# Patient Record
Sex: Female | Born: 1945 | Race: Black or African American | Hispanic: No | Smoking: Current every day smoker
Health system: Southern US, Community
[De-identification: ages and names within clinical notes are randomized; demographics above are authoritative.]

## PROBLEM LIST (undated history)

## (undated) DIAGNOSIS — S82143A Displaced bicondylar fracture of unspecified tibia, initial encounter for closed fracture: Secondary | ICD-10-CM

## (undated) DIAGNOSIS — C801 Malignant (primary) neoplasm, unspecified: Secondary | ICD-10-CM

## (undated) DIAGNOSIS — R202 Paresthesia of skin: Secondary | ICD-10-CM

## (undated) DIAGNOSIS — F32A Depression, unspecified: Secondary | ICD-10-CM

## (undated) DIAGNOSIS — R079 Chest pain, unspecified: Secondary | ICD-10-CM

## (undated) DIAGNOSIS — R011 Cardiac murmur, unspecified: Secondary | ICD-10-CM

## (undated) DIAGNOSIS — R0902 Hypoxemia: Secondary | ICD-10-CM

## (undated) DIAGNOSIS — I10 Essential (primary) hypertension: Secondary | ICD-10-CM

## (undated) DIAGNOSIS — R200 Anesthesia of skin: Secondary | ICD-10-CM

## (undated) DIAGNOSIS — D649 Anemia, unspecified: Secondary | ICD-10-CM

## (undated) DIAGNOSIS — K219 Gastro-esophageal reflux disease without esophagitis: Secondary | ICD-10-CM

## (undated) DIAGNOSIS — I499 Cardiac arrhythmia, unspecified: Secondary | ICD-10-CM

## (undated) DIAGNOSIS — F329 Major depressive disorder, single episode, unspecified: Secondary | ICD-10-CM

## (undated) DIAGNOSIS — H269 Unspecified cataract: Secondary | ICD-10-CM

## (undated) DIAGNOSIS — G4730 Sleep apnea, unspecified: Secondary | ICD-10-CM

## (undated) DIAGNOSIS — M1990 Unspecified osteoarthritis, unspecified site: Secondary | ICD-10-CM

## (undated) DIAGNOSIS — F419 Anxiety disorder, unspecified: Secondary | ICD-10-CM

## (undated) DIAGNOSIS — J449 Chronic obstructive pulmonary disease, unspecified: Secondary | ICD-10-CM

## (undated) DIAGNOSIS — R0602 Shortness of breath: Secondary | ICD-10-CM

## (undated) HISTORY — DX: Anesthesia of skin: R20.0

## (undated) HISTORY — DX: Cardiac arrhythmia, unspecified: I49.9

## (undated) HISTORY — DX: Anesthesia of skin: R20.2

## (undated) HISTORY — PX: COLONOSCOPY: SHX174

## (undated) HISTORY — DX: Chest pain, unspecified: R07.9

## (undated) HISTORY — DX: Chronic obstructive pulmonary disease, unspecified: J44.9

## (undated) HISTORY — PX: TUBAL LIGATION: SHX77

---

## 2010-07-23 ENCOUNTER — Inpatient Hospital Stay (HOSPITAL_COMMUNITY)
Admission: AD | Admit: 2010-07-23 | Discharge: 2010-07-27 | Payer: Self-pay | Source: Home / Self Care | Attending: Psychiatry | Admitting: Psychiatry

## 2010-10-31 LAB — DIFFERENTIAL
Basophils Absolute: 0 10*3/uL (ref 0.0–0.1)
Basophils Relative: 1 % (ref 0–1)
Eosinophils Absolute: 0.1 10*3/uL (ref 0.0–0.7)
Eosinophils Relative: 1 % (ref 0–5)
Lymphocytes Relative: 25 % (ref 12–46)
Lymphs Abs: 1.7 10*3/uL (ref 0.7–4.0)
Monocytes Absolute: 0.3 10*3/uL (ref 0.1–1.0)
Monocytes Relative: 5 % (ref 3–12)
Neutro Abs: 4.6 10*3/uL (ref 1.7–7.7)
Neutrophils Relative %: 69 % (ref 43–77)

## 2010-10-31 LAB — BASIC METABOLIC PANEL
BUN: 10 mg/dL (ref 6–23)
CO2: 27 mEq/L (ref 19–32)
Calcium: 9.4 mg/dL (ref 8.4–10.5)
Chloride: 103 mEq/L (ref 96–112)
Creatinine, Ser: 0.79 mg/dL (ref 0.4–1.2)
GFR calc Af Amer: >60 mL/min (ref >60)
GFR calc non Af Amer: >60 mL/min (ref >60)
Glucose, Bld: 99 mg/dL (ref 70–99)
Potassium: 4.1 mEq/L (ref 3.5–5.1)
Sodium: 139 mEq/L (ref 135–145)

## 2010-10-31 LAB — CBC
HCT: 36.9 % (ref 36.0–46.0)
Hemoglobin: 12.6 g/dL (ref 12.0–15.0)
MCH: 32.7 pg (ref 26.0–34.0)
MCHC: 34.1 g/dL (ref 30.0–36.0)
MCV: 95.8 fL (ref 78.0–100.0)
Platelets: 194 10*3/uL (ref 150–400)
RBC: 3.85 MIL/uL — ABNORMAL LOW (ref 3.87–5.11)
RDW: 13.3 % (ref 11.5–15.5)
WBC: 6.7 10*3/uL (ref 4.0–10.5)

## 2010-10-31 LAB — TSH: TSH: 1.047 u[IU]/mL (ref 0.350–4.500)

## 2010-10-31 LAB — VITAMIN B12: Vitamin B-12: 347 pg/mL (ref 211–911)

## 2010-10-31 LAB — RPR: RPR Ser Ql: NONREACTIVE

## 2012-12-07 HISTORY — PX: INCISION AND DRAINAGE OF WOUND: SHX1803

## 2012-12-16 ENCOUNTER — Ambulatory Visit
Admission: RE | Admit: 2012-12-16 | Discharge: 2012-12-16 | Disposition: A | Discharge status: Home / Self Care | Payer: Medicare Other | Source: Ambulatory Visit | Attending: Orthopedic Surgery | Admitting: Orthopedic Surgery

## 2012-12-16 ENCOUNTER — Other Ambulatory Visit: Payer: Self-pay | Admitting: Orthopedic Surgery

## 2012-12-16 DIAGNOSIS — R52 Pain, unspecified: Secondary | ICD-10-CM

## 2012-12-22 ENCOUNTER — Encounter (HOSPITAL_COMMUNITY): Payer: Self-pay | Admitting: Pharmacy Technician

## 2012-12-22 ENCOUNTER — Encounter (HOSPITAL_COMMUNITY): Payer: Self-pay | Admitting: Orthopedic Surgery

## 2012-12-23 ENCOUNTER — Encounter (HOSPITAL_COMMUNITY): Payer: Self-pay | Admitting: *Deleted

## 2012-12-23 ENCOUNTER — Encounter (HOSPITAL_COMMUNITY): Payer: Self-pay | Admitting: Anesthesiology

## 2012-12-23 ENCOUNTER — Encounter (HOSPITAL_COMMUNITY)
Admission: RE | Disposition: A | Discharge status: Skilled Nursing Facility | Payer: Self-pay | Source: Ambulatory Visit | Attending: Orthopedic Surgery

## 2012-12-23 ENCOUNTER — Inpatient Hospital Stay (HOSPITAL_COMMUNITY): Payer: Medicare Other

## 2012-12-23 ENCOUNTER — Inpatient Hospital Stay (HOSPITAL_COMMUNITY): Payer: Medicare Other | Admitting: Anesthesiology

## 2012-12-23 ENCOUNTER — Inpatient Hospital Stay (HOSPITAL_COMMUNITY)
Admission: RE | Admit: 2012-12-23 | Discharge: 2012-12-26 | DRG: 494 | Disposition: A | Discharge status: Skilled Nursing Facility | Payer: Medicare Other | Source: Ambulatory Visit | Attending: Orthopedic Surgery | Admitting: Orthopedic Surgery

## 2012-12-23 DIAGNOSIS — S82109A Unspecified fracture of upper end of unspecified tibia, initial encounter for closed fracture: Principal | ICD-10-CM | POA: Diagnosis present

## 2012-12-23 DIAGNOSIS — F3289 Other specified depressive episodes: Secondary | ICD-10-CM | POA: Diagnosis present

## 2012-12-23 DIAGNOSIS — S82142A Displaced bicondylar fracture of left tibia, initial encounter for closed fracture: Secondary | ICD-10-CM

## 2012-12-23 DIAGNOSIS — Z79899 Other long term (current) drug therapy: Secondary | ICD-10-CM

## 2012-12-23 DIAGNOSIS — K219 Gastro-esophageal reflux disease without esophagitis: Secondary | ICD-10-CM | POA: Diagnosis present

## 2012-12-23 DIAGNOSIS — X58XXXA Exposure to other specified factors, initial encounter: Secondary | ICD-10-CM | POA: Diagnosis present

## 2012-12-23 DIAGNOSIS — Z7982 Long term (current) use of aspirin: Secondary | ICD-10-CM

## 2012-12-23 DIAGNOSIS — F17200 Nicotine dependence, unspecified, uncomplicated: Secondary | ICD-10-CM | POA: Diagnosis present

## 2012-12-23 DIAGNOSIS — F411 Generalized anxiety disorder: Secondary | ICD-10-CM | POA: Diagnosis present

## 2012-12-23 DIAGNOSIS — G4730 Sleep apnea, unspecified: Secondary | ICD-10-CM | POA: Diagnosis present

## 2012-12-23 DIAGNOSIS — S82143A Displaced bicondylar fracture of unspecified tibia, initial encounter for closed fracture: Secondary | ICD-10-CM

## 2012-12-23 DIAGNOSIS — F329 Major depressive disorder, single episode, unspecified: Secondary | ICD-10-CM | POA: Diagnosis present

## 2012-12-23 DIAGNOSIS — I10 Essential (primary) hypertension: Secondary | ICD-10-CM | POA: Diagnosis present

## 2012-12-23 HISTORY — DX: Anemia, unspecified: D64.9

## 2012-12-23 HISTORY — DX: Cardiac murmur, unspecified: R01.1

## 2012-12-23 HISTORY — DX: Essential (primary) hypertension: I10

## 2012-12-23 HISTORY — DX: Shortness of breath: R06.02

## 2012-12-23 HISTORY — DX: Displaced bicondylar fracture of unspecified tibia, initial encounter for closed fracture: S82.143A

## 2012-12-23 HISTORY — DX: Major depressive disorder, single episode, unspecified: F32.9

## 2012-12-23 HISTORY — PX: ORIF TIBIA PLATEAU: SHX2132

## 2012-12-23 HISTORY — DX: Sleep apnea, unspecified: G47.30

## 2012-12-23 HISTORY — DX: Anxiety disorder, unspecified: F41.9

## 2012-12-23 HISTORY — DX: Depression, unspecified: F32.A

## 2012-12-23 HISTORY — DX: Gastro-esophageal reflux disease without esophagitis: K21.9

## 2012-12-23 HISTORY — DX: Unspecified osteoarthritis, unspecified site: M19.90

## 2012-12-23 HISTORY — PX: ORIF PROXIMAL TIBIAL PLATEAU FRACTURE: SUR953

## 2012-12-23 LAB — BASIC METABOLIC PANEL
BUN: 17 mg/dL (ref 6–23)
CO2: 31 mEq/L (ref 19–32)
Calcium: 9.9 mg/dL (ref 8.4–10.5)
Chloride: 96 mEq/L (ref 96–112)
Creatinine, Ser: 1 mg/dL (ref 0.50–1.10)
GFR calc Af Amer: 67 mL/min — ABNORMAL LOW (ref >90)
GFR calc non Af Amer: 58 mL/min — ABNORMAL LOW (ref >90)
Glucose, Bld: 104 mg/dL — ABNORMAL HIGH (ref 70–99)
Potassium: 3.9 mEq/L (ref 3.5–5.1)
Sodium: 136 mEq/L (ref 135–145)

## 2012-12-23 LAB — CBC
HCT: 32.9 % — ABNORMAL LOW (ref 36.0–46.0)
Hemoglobin: 11.3 g/dL — ABNORMAL LOW (ref 12.0–15.0)
MCH: 29.8 pg (ref 26.0–34.0)
MCHC: 34.3 g/dL (ref 30.0–36.0)
MCV: 86.8 fL (ref 78.0–100.0)
Platelets: 456 10*3/uL — ABNORMAL HIGH (ref 150–400)
RBC: 3.79 MIL/uL — ABNORMAL LOW (ref 3.87–5.11)
RDW: 13.7 % (ref 11.5–15.5)
WBC: 8.4 10*3/uL (ref 4.0–10.5)

## 2012-12-23 LAB — SURGICAL PCR SCREEN
MRSA, PCR: NEGATIVE
Staphylococcus aureus: NEGATIVE

## 2012-12-23 SURGERY — OPEN REDUCTION INTERNAL FIXATION (ORIF) TIBIAL PLATEAU
Anesthesia: General | Site: Knee | Laterality: Left | Wound class: Clean

## 2012-12-23 MED ORDER — DOCUSATE SODIUM 100 MG PO CAPS
100.0000 mg | ORAL_CAPSULE | Freq: Two times a day (BID) | ORAL | Status: DC
  Administered 2012-12-23 – 2012-12-26 (×6): 100 mg via ORAL
  Filled 2012-12-23 (×8): qty 1

## 2012-12-23 MED ORDER — LORAZEPAM 0.5 MG PO TABS: 0.5000 mg | ORAL_TABLET | Freq: Every evening | ORAL | Status: DC | PRN

## 2012-12-23 MED ORDER — BUPIVACAINE HCL (PF) 0.25 % IJ SOLN
INTRAMUSCULAR | Status: DC | PRN
  Administered 2012-12-23: 30 mL

## 2012-12-23 MED ORDER — EPHEDRINE SULFATE 50 MG/ML IJ SOLN
INTRAMUSCULAR | Status: DC | PRN
  Administered 2012-12-23: 5 mg via INTRAVENOUS
  Administered 2012-12-23: 10 mg via INTRAVENOUS

## 2012-12-23 MED ORDER — WARFARIN SODIUM 7.5 MG PO TABS
7.5000 mg | ORAL_TABLET | Freq: Once | ORAL | Status: AC
  Administered 2012-12-23: 7.5 mg via ORAL
  Filled 2012-12-23: qty 1

## 2012-12-23 MED ORDER — PROPOFOL 10 MG/ML IV BOLUS
INTRAVENOUS | Status: DC | PRN
  Administered 2012-12-23: 160 mg via INTRAVENOUS

## 2012-12-23 MED ORDER — PHENOL 1.4 % MT LIQD
1.0000 | OROMUCOSAL | Status: DC | PRN
  Filled 2012-12-23: qty 177

## 2012-12-23 MED ORDER — MENTHOL 3 MG MT LOZG
1.0000 | LOZENGE | OROMUCOSAL | Status: DC | PRN
  Filled 2012-12-23 (×2): qty 9

## 2012-12-23 MED ORDER — ENOXAPARIN SODIUM 40 MG/0.4ML ~~LOC~~ SOLN
40.0000 mg | SUBCUTANEOUS | Status: DC
  Administered 2012-12-24 – 2012-12-26 (×3): 40 mg via SUBCUTANEOUS
  Filled 2012-12-23 (×4): qty 0.4

## 2012-12-23 MED ORDER — ROCURONIUM BROMIDE 100 MG/10ML IV SOLN
INTRAVENOUS | Status: DC | PRN
  Administered 2012-12-23: 30 mg via INTRAVENOUS

## 2012-12-23 MED ORDER — CEFAZOLIN SODIUM-DEXTROSE 2-3 GM-% IV SOLR
2.0000 g | INTRAVENOUS | Status: AC
  Administered 2012-12-23: 2 g via INTRAVENOUS
  Filled 2012-12-23: qty 50

## 2012-12-23 MED ORDER — CEFAZOLIN SODIUM-DEXTROSE 2-3 GM-% IV SOLR
2.0000 g | Freq: Once | INTRAVENOUS | Status: AC
  Administered 2012-12-24: 2 g via INTRAVENOUS
  Filled 2012-12-23: qty 50

## 2012-12-23 MED ORDER — BISACODYL 10 MG RE SUPP: 10.0000 mg | Freq: Every day | RECTAL | Status: DC | PRN

## 2012-12-23 MED ORDER — ONDANSETRON HCL 4 MG/2ML IJ SOLN: 4.0000 mg | Freq: Once | INTRAMUSCULAR | Status: DC | PRN

## 2012-12-23 MED ORDER — MIDAZOLAM HCL 5 MG/5ML IJ SOLN
INTRAMUSCULAR | Status: DC | PRN
  Administered 2012-12-23: 2 mg via INTRAVENOUS

## 2012-12-23 MED ORDER — PHENYLEPHRINE HCL 10 MG/ML IJ SOLN
INTRAMUSCULAR | Status: DC | PRN
  Administered 2012-12-23 (×2): 40 ug via INTRAVENOUS

## 2012-12-23 MED ORDER — ARTIFICIAL TEARS OP OINT
TOPICAL_OINTMENT | OPHTHALMIC | Status: DC | PRN
  Administered 2012-12-23: 1 via OPHTHALMIC

## 2012-12-23 MED ORDER — FENTANYL CITRATE 0.05 MG/ML IJ SOLN
INTRAMUSCULAR | Status: DC | PRN
  Administered 2012-12-23: 50 ug via INTRAVENOUS
  Administered 2012-12-23 (×2): 100 ug via INTRAVENOUS

## 2012-12-23 MED ORDER — ACETAMINOPHEN 325 MG PO TABS: 650.0000 mg | ORAL_TABLET | Freq: Four times a day (QID) | ORAL | Status: DC | PRN

## 2012-12-23 MED ORDER — NEOSTIGMINE METHYLSULFATE 1 MG/ML IJ SOLN
INTRAMUSCULAR | Status: DC | PRN
  Administered 2012-12-23: 3 mg via INTRAVENOUS

## 2012-12-23 MED ORDER — ONDANSETRON HCL 4 MG/2ML IJ SOLN
INTRAMUSCULAR | Status: DC | PRN
  Administered 2012-12-23: 4 mg via INTRAVENOUS

## 2012-12-23 MED ORDER — COUMADIN BOOK
Freq: Once | Status: AC
  Administered 2012-12-23: 16:00:00
  Filled 2012-12-23: qty 1

## 2012-12-23 MED ORDER — LOSARTAN POTASSIUM 50 MG PO TABS
50.0000 mg | ORAL_TABLET | Freq: Every day | ORAL | Status: DC
  Administered 2012-12-23 – 2012-12-26 (×4): 50 mg via ORAL
  Filled 2012-12-23 (×4): qty 1

## 2012-12-23 MED ORDER — ONDANSETRON HCL 4 MG/2ML IJ SOLN
4.0000 mg | Freq: Four times a day (QID) | INTRAMUSCULAR | Status: DC | PRN
  Administered 2012-12-23: 4 mg via INTRAVENOUS
  Filled 2012-12-23: qty 2

## 2012-12-23 MED ORDER — LACTATED RINGERS IV SOLN
INTRAVENOUS | Status: DC | PRN
  Administered 2012-12-23 (×2): via INTRAVENOUS

## 2012-12-23 MED ORDER — MUPIROCIN 2 % EX OINT
TOPICAL_OINTMENT | Freq: Two times a day (BID) | CUTANEOUS | Status: DC
  Administered 2012-12-23: 23:00:00 via NASAL
  Administered 2012-12-23: 1 via NASAL
  Administered 2012-12-24: 22:00:00 via NASAL
  Administered 2012-12-25: 1 via NASAL
  Administered 2012-12-25: 21:00:00 via NASAL

## 2012-12-23 MED ORDER — ASPIRIN EC 81 MG PO TBEC
81.0000 mg | DELAYED_RELEASE_TABLET | Freq: Every day | ORAL | Status: DC
  Administered 2012-12-23 – 2012-12-26 (×3): 81 mg via ORAL
  Filled 2012-12-23 (×4): qty 1

## 2012-12-23 MED ORDER — LACTATED RINGERS IV SOLN
INTRAVENOUS | Status: DC
  Administered 2012-12-23: 09:00:00 via INTRAVENOUS

## 2012-12-23 MED ORDER — POLYETHYLENE GLYCOL 3350 17 G PO PACK
17.0000 g | PACK | Freq: Every day | ORAL | Status: DC | PRN
  Filled 2012-12-23: qty 1

## 2012-12-23 MED ORDER — MAGNESIUM CITRATE PO SOLN
1.0000 | Freq: Once | ORAL | Status: AC | PRN
  Filled 2012-12-23: qty 296

## 2012-12-23 MED ORDER — SENNA-DOCUSATE SODIUM 8.6-50 MG PO TABS: 1.0000 | ORAL_TABLET | Freq: Every day | ORAL | Status: DC

## 2012-12-23 MED ORDER — ALUM & MAG HYDROXIDE-SIMETH 200-200-20 MG/5ML PO SUSP: 30.0000 mL | ORAL | Status: DC | PRN

## 2012-12-23 MED ORDER — METOCLOPRAMIDE HCL 5 MG/ML IJ SOLN
5.0000 mg | Freq: Three times a day (TID) | INTRAMUSCULAR | Status: DC | PRN
  Filled 2012-12-23: qty 2

## 2012-12-23 MED ORDER — LORATADINE 10 MG PO TABS
10.0000 mg | ORAL_TABLET | Freq: Every day | ORAL | Status: DC
  Administered 2012-12-23 – 2012-12-26 (×4): 10 mg via ORAL
  Filled 2012-12-23 (×4): qty 1

## 2012-12-23 MED ORDER — OXYCODONE HCL 5 MG PO TABS
5.0000 mg | ORAL_TABLET | ORAL | Status: DC | PRN
  Administered 2012-12-23 – 2012-12-26 (×12): 10 mg via ORAL
  Filled 2012-12-23 (×12): qty 2

## 2012-12-23 MED ORDER — SENNA 8.6 MG PO TABS
1.0000 | ORAL_TABLET | Freq: Two times a day (BID) | ORAL | Status: DC
  Administered 2012-12-23 – 2012-12-26 (×6): 8.6 mg via ORAL
  Filled 2012-12-23 (×7): qty 1

## 2012-12-23 MED ORDER — WARFARIN - PHARMACIST DOSING INPATIENT: Freq: Every day | Status: DC

## 2012-12-23 MED ORDER — OXYCODONE-ACETAMINOPHEN 10-325 MG PO TABS: 1.0000 | ORAL_TABLET | Freq: Four times a day (QID) | ORAL | Status: DC | PRN

## 2012-12-23 MED ORDER — WARFARIN SODIUM 5 MG PO TABS: 5.0000 mg | ORAL_TABLET | Freq: Every day | ORAL | Status: DC

## 2012-12-23 MED ORDER — FLUOXETINE HCL 20 MG PO CAPS
60.0000 mg | ORAL_CAPSULE | Freq: Every day | ORAL | Status: DC
  Administered 2012-12-23 – 2012-12-26 (×4): 60 mg via ORAL
  Filled 2012-12-23 (×4): qty 3

## 2012-12-23 MED ORDER — ONDANSETRON HCL 4 MG PO TABS: 4.0000 mg | ORAL_TABLET | Freq: Four times a day (QID) | ORAL | Status: DC | PRN

## 2012-12-23 MED ORDER — MUPIROCIN 2 % EX OINT
TOPICAL_OINTMENT | CUTANEOUS | Status: AC
  Filled 2012-12-23: qty 22

## 2012-12-23 MED ORDER — HYDROCHLOROTHIAZIDE 12.5 MG PO CAPS
12.5000 mg | ORAL_CAPSULE | Freq: Every day | ORAL | Status: DC
  Administered 2012-12-23 – 2012-12-26 (×4): 12.5 mg via ORAL
  Filled 2012-12-23 (×4): qty 1

## 2012-12-23 MED ORDER — HYDROMORPHONE HCL PF 1 MG/ML IJ SOLN
INTRAMUSCULAR | Status: AC
  Filled 2012-12-23: qty 1

## 2012-12-23 MED ORDER — POTASSIUM CHLORIDE IN NACL 20-0.45 MEQ/L-% IV SOLN
INTRAVENOUS | Status: DC
  Administered 2012-12-23: 20 mL/h via INTRAVENOUS
  Filled 2012-12-23 (×4): qty 1000

## 2012-12-23 MED ORDER — CEFAZOLIN SODIUM-DEXTROSE 2-3 GM-% IV SOLR
INTRAVENOUS | Status: AC
  Filled 2012-12-23: qty 50

## 2012-12-23 MED ORDER — HYDROMORPHONE HCL PF 1 MG/ML IJ SOLN
0.5000 mg | INTRAMUSCULAR | Status: DC | PRN
  Administered 2012-12-23 – 2012-12-24 (×8): 0.5 mg via INTRAVENOUS
  Filled 2012-12-23 (×9): qty 1

## 2012-12-23 MED ORDER — LIDOCAINE HCL (CARDIAC) 20 MG/ML IV SOLN
INTRAVENOUS | Status: DC | PRN
  Administered 2012-12-23: 60 mg via INTRAVENOUS

## 2012-12-23 MED ORDER — 0.9 % SODIUM CHLORIDE (POUR BTL) OPTIME
TOPICAL | Status: DC | PRN
  Administered 2012-12-23: 1000 mL

## 2012-12-23 MED ORDER — ACETAMINOPHEN 650 MG RE SUPP: 650.0000 mg | Freq: Four times a day (QID) | RECTAL | Status: DC | PRN

## 2012-12-23 MED ORDER — METOCLOPRAMIDE HCL 10 MG PO TABS: 5.0000 mg | ORAL_TABLET | Freq: Three times a day (TID) | ORAL | Status: DC | PRN

## 2012-12-23 MED ORDER — CEFAZOLIN SODIUM-DEXTROSE 2-3 GM-% IV SOLR
2.0000 g | Freq: Four times a day (QID) | INTRAVENOUS | Status: DC
  Administered 2012-12-23: 2 g via INTRAVENOUS
  Filled 2012-12-23 (×2): qty 50

## 2012-12-23 MED ORDER — HYDROMORPHONE HCL PF 1 MG/ML IJ SOLN
0.2500 mg | INTRAMUSCULAR | Status: DC | PRN
  Administered 2012-12-23 (×3): 0.5 mg via INTRAVENOUS

## 2012-12-23 MED ORDER — GLYCOPYRROLATE 0.2 MG/ML IJ SOLN
INTRAMUSCULAR | Status: DC | PRN
  Administered 2012-12-23: 0.4 mg via INTRAVENOUS

## 2012-12-23 MED ORDER — LOSARTAN POTASSIUM-HCTZ 50-12.5 MG PO TABS: 1.0000 | ORAL_TABLET | Freq: Every day | ORAL | Status: DC

## 2012-12-23 MED ORDER — BUPIVACAINE HCL (PF) 0.25 % IJ SOLN
INTRAMUSCULAR | Status: AC
  Filled 2012-12-23: qty 30

## 2012-12-23 MED ORDER — WARFARIN VIDEO
Freq: Once | Status: AC
  Administered 2012-12-23: 16:00:00

## 2012-12-23 SURGICAL SUPPLY — 67 items
BANDAGE ELASTIC 6 VELCRO ST LF (GAUZE/BANDAGES/DRESSINGS) IMPLANT
BANDAGE ESMARK 6X9 LF (GAUZE/BANDAGES/DRESSINGS) ×1 IMPLANT
BENZOIN TINCTURE PRP APPL 2/3 (GAUZE/BANDAGES/DRESSINGS) ×2 IMPLANT
BNDG ELASTIC 6X15 VLCR STRL LF (GAUZE/BANDAGES/DRESSINGS) ×2 IMPLANT
BNDG ESMARK 6X9 LF (GAUZE/BANDAGES/DRESSINGS) ×2
BOOTCOVER CLEANROOM LRG (PROTECTIVE WEAR) IMPLANT
CLOTH BEACON ORANGE TIMEOUT ST (SAFETY) ×2 IMPLANT
COVER SURGICAL LIGHT HANDLE (MISCELLANEOUS) ×2 IMPLANT
COVER TABLE BACK 60X90 (DRAPES) ×2 IMPLANT
CUFF TOURNIQUET SINGLE 34IN LL (TOURNIQUET CUFF) ×2 IMPLANT
CUFF TOURNIQUET SINGLE 44IN (TOURNIQUET CUFF) IMPLANT
DECANTER SPIKE VIAL GLASS SM (MISCELLANEOUS) IMPLANT
DRAPE C-ARM 42X72 X-RAY (DRAPES) ×2 IMPLANT
DRAPE C-ARMOR (DRAPES) ×2 IMPLANT
DRAPE OEC MINIVIEW 54X84 (DRAPES) IMPLANT
DRAPE ORTHO SPLIT 77X108 STRL (DRAPES) ×1
DRAPE SURG ORHT 6 SPLT 77X108 (DRAPES) ×1 IMPLANT
DRAPE U-SHAPE 47X51 STRL (DRAPES) ×2 IMPLANT
DRSG PAD ABDOMINAL 8X10 ST (GAUZE/BANDAGES/DRESSINGS) ×2 IMPLANT
DURAPREP 26ML APPLICATOR (WOUND CARE) ×2 IMPLANT
ELECT REM PT RETURN 9FT ADLT (ELECTROSURGICAL) ×2
ELECTRODE REM PT RTRN 9FT ADLT (ELECTROSURGICAL) ×1 IMPLANT
GAUZE XEROFORM 1X8 LF (GAUZE/BANDAGES/DRESSINGS) IMPLANT
GLOVE BIOGEL PI IND STRL 7.5 (GLOVE) ×1 IMPLANT
GLOVE BIOGEL PI INDICATOR 7.5 (GLOVE) ×1
GLOVE BIOGEL PI ORTHO PRO SZ8 (GLOVE) ×1
GLOVE ORTHO TXT STRL SZ7.5 (GLOVE) ×2 IMPLANT
GLOVE PI ORTHO PRO STRL SZ8 (GLOVE) ×1 IMPLANT
GLOVE SURG ORTHO 8.0 STRL STRW (GLOVE) ×4 IMPLANT
GOWN STRL NON-REIN LRG LVL3 (GOWN DISPOSABLE) IMPLANT
K-WIRE 5 THRD TROCAR 1.6X150 (WIRE) ×4
KIT BASIN OR (CUSTOM PROCEDURE TRAY) ×2 IMPLANT
KIT ROOM TURNOVER OR (KITS) ×2 IMPLANT
KWIRE 5 THRD TROCAR 1.6X150 (WIRE) ×2 IMPLANT
MANIFOLD NEPTUNE II (INSTRUMENTS) IMPLANT
NEEDLE 22X1 1/2 (OR ONLY) (NEEDLE) ×2 IMPLANT
NS IRRIG 1000ML POUR BTL (IV SOLUTION) ×2 IMPLANT
PACK ORTHO EXTREMITY (CUSTOM PROCEDURE TRAY) ×2 IMPLANT
PAD ARMBOARD 7.5X6 YLW CONV (MISCELLANEOUS) ×4 IMPLANT
PAD CAST 4YDX4 CTTN HI CHSV (CAST SUPPLIES) IMPLANT
PADDING CAST COTTON 4X4 STRL (CAST SUPPLIES)
PADDING CAST COTTON 6X4 STRL (CAST SUPPLIES) ×2 IMPLANT
PLATE TIBIA LCP 6H LEFT 107MM (Plate) ×2 IMPLANT
SCREW CORTEX 3.5 28MM (Screw) ×1 IMPLANT
SCREW CORTEX 3.5 30MM (Screw) ×2 IMPLANT
SCREW CORTEX 3.5X75MM (Screw) ×2 IMPLANT
SCREW LOCK CORT ST 3.5X28 (Screw) ×1 IMPLANT
SCREW LOCK CORT ST 3.5X30 (Screw) ×2 IMPLANT
SCREW LOCKING SLF TAP 3.5X70MM (Screw) ×4 IMPLANT
SCREW LOCKING SLF TAP 3.5X75MM (Screw) ×4 IMPLANT
SCREW SELF TAP 65MM (Screw) ×4 IMPLANT
SPONGE GAUZE 4X4 12PLY (GAUZE/BANDAGES/DRESSINGS) ×2 IMPLANT
SPONGE LAP 4X18 X RAY DECT (DISPOSABLE) ×4 IMPLANT
STAPLER VISISTAT 35W (STAPLE) ×2 IMPLANT
STRIP CLOSURE SKIN 1/2X4 (GAUZE/BANDAGES/DRESSINGS) ×4 IMPLANT
SUCTION FRAZIER TIP 10 FR DISP (SUCTIONS) ×2 IMPLANT
SUT MNCRL AB 4-0 PS2 18 (SUTURE) ×2 IMPLANT
SUT VIC AB 0 CT1 27 (SUTURE) ×1
SUT VIC AB 0 CT1 27XBRD ANBCTR (SUTURE) ×1 IMPLANT
SUT VIC AB 3-0 SH 8-18 (SUTURE) ×2 IMPLANT
SYR CONTROL 10ML LL (SYRINGE) ×2 IMPLANT
TOWEL OR 17X24 6PK STRL BLUE (TOWEL DISPOSABLE) ×2 IMPLANT
TOWEL OR 17X26 10 PK STRL BLUE (TOWEL DISPOSABLE) ×2 IMPLANT
TUBE CONNECTING 12X1/4 (SUCTIONS) ×2 IMPLANT
UNDERPAD 30X30 INCONTINENT (UNDERPADS AND DIAPERS) IMPLANT
WATER STERILE IRR 1000ML POUR (IV SOLUTION) IMPLANT
YANKAUER SUCT BULB TIP NO VENT (SUCTIONS) ×2 IMPLANT

## 2012-12-23 NOTE — Preoperative (Signed)
Beta Blockers   Reason not to administer Beta Blockers:Not Applicable 

## 2012-12-23 NOTE — Progress Notes (Signed)
UR COMPLETED  

## 2012-12-23 NOTE — Addendum Note (Signed)
Addendum created 12/23/12 1350 by Fransisca Kaufmann, CRNA   Modules edited: Anesthesia Medication Administration

## 2012-12-23 NOTE — Transfer of Care (Signed)
Immediate Anesthesia Transfer of Care Note  Patient: Katrina Ball  Procedure(s) Performed: Procedure(s): OPEN REDUCTION INTERNAL FIXATION (ORIF) LEFT TIBIAL PLATEAU (Left)  Patient Location: PACU   Anesthesia Type:General  Level of Consciousness: awake, alert , oriented and sedated  Airway & Oxygen Therapy: Patient Spontanous Breathing and Patient connected to nasal cannula oxygen  Post-op Assessment: Report given to PACU RN, Post -op Vital signs reviewed and stable and Patient moving all extremities  Post vital signs: Reviewed and stable  Complications: No apparent anesthesia complications

## 2012-12-23 NOTE — Anesthesia Preprocedure Evaluation (Signed)
Anesthesia Evaluation  Patient identified by MRN, date of birth, ID band Patient awake    Reviewed: Allergy & Precautions, H&P , NPO status , Patient's Chart, lab work & pertinent test results  Airway Mallampati: II TM Distance: >3 FB Neck ROM: full    Dental   Pulmonary COPDCurrent Smoker,          Cardiovascular hypertension, Pt. on medications Rhythm:regular Rate:Normal     Neuro/Psych    GI/Hepatic   Endo/Other    Renal/GU      Musculoskeletal   Abdominal   Peds  Hematology   Anesthesia Other Findings   Reproductive/Obstetrics                           Anesthesia Physical Anesthesia Plan  ASA: II  Anesthesia Plan: General   Post-op Pain Management:    Induction: Intravenous  Airway Management Planned: Oral ETT  Additional Equipment:   Intra-op Plan:   Post-operative Plan: Extubation in OR  Informed Consent: I have reviewed the patients History and Physical, chart, labs and discussed the procedure including the risks, benefits and alternatives for the proposed anesthesia with the patient or authorized representative who has indicated his/her understanding and acceptance.     Plan Discussed with: CRNA, Anesthesiologist and Surgeon  Anesthesia Plan Comments:         Anesthesia Quick Evaluation

## 2012-12-23 NOTE — Progress Notes (Signed)
Good cms t/o pacu stay, 2 plus pedal pulse,able to wiggle toes

## 2012-12-23 NOTE — Op Note (Signed)
12/23/2012  11:27 AM  PATIENT:  Katrina Ball    PRE-OPERATIVE DIAGNOSIS:  Left Tibial Plateau Fracture, bicondylar with intracondylar split  POST-OPERATIVE DIAGNOSIS:  Same  PROCEDURE:  OPEN REDUCTION INTERNAL FIXATION (ORIF) LEFT BICONDYLAR TIBIAL PLATEAU fracture  SURGEON:  Eulas Post, MD  PHYSICIAN ASSISTANT: Janace Litten, OPA-C, present and scrubbed throughout the case, critical for completion in a timely fashion, and for retraction, instrumentation, and closure.  ANESTHESIA:   General  PREOPERATIVE INDICATIONS:  Katrina Ball is a  67 y.o. female with a diagnosis of Left Tibial Plateau Fracture who failed conservative measures and elected for surgical management.  She had some varus alignment, with mild depression of the medial condyle. The rest of the fracture was minimally displaced. I recommended surgical intervention to stabilize the fracture, optimize healing, and minimize the risk for posttraumatic varus osteoarthritis.  The risks benefits and alternatives were discussed with the patient preoperatively including but not limited to the risks of infection, bleeding, nerve injury, cardiopulmonary complications, the need for revision surgery, among others, and the patient was willing to proceed.  OPERATIVE IMPLANTS: Synthes proximal tibial locking plate with a total of 4 proximal locking screws, one could and screw, and 3 distal cortical screws.  OPERATIVE FINDINGS: Mild varus alignment it was correctable with the use of a large clamp and reduction maneuvers.   OPERATIVE PROCEDURE:  the patient is brought to the operating room and placed in supine position. General anesthesia was administered. IV antibiotics were given. The left lower extremity was prepped and draped in usual sterile fashion. Time out was performed. The leg was elevated and exsanguinated and the tourniquet was inflated. Total tourniquet time was approximately one hour and 15 minutes at 300 mm mercury. She  had a transverse laceration over her proximal tibia, from the previous trauma, which had mostly healed. I removed the staples. A longitudinal incision was made lateral to the crest of the tibia up to the level of the joint line. Dissection was carried down and the periosteum and fascia of the anterior compartment was elevated. I did not open the joint. I exposed the proximal tibia, and applied the plate into the appropriate location. I confirmed its position on AP and lateral views. I used K wires initially, and slightly distally, in order to allow for ultimate correction of the varus alignment. I also used a cane, and clamp percutaneously over the skin medially, and on the posterior hole of the plate laterally. I placed a bicortical proximal nonlocking screw first, to compress the joint line, reduced the fracture, and bring the plate to the proximal tibia. I then secured the plate with cortical locking screws proximally, and converted the nonlocking screw to a cortical screw.  I confirmed anatomic reduction of the joint line on AP, lateral, and oblique views. I then secured the plate distally using cortical screws. Excellent fixation was achieved.  I placed one more proximal screw through the kick stand in order to buttress the medial side.  Final C-arm pictures were taken, and I irrigated the wounds copiously, and repaired the fascia with Vicryl followed by Vicryl and Monocryl with Steri-Strips and sterile gauze across the previous laceration as well as the surgical incision. She was placed back into her knee immobilizer. She was awakened and returned to the PACU in stable and satisfactory condition. There were no complications and she tolerated the procedure well. She will be nonweightbearing for a period of 8 weeks, and then 50% weightbearing for another 4 weeks after  that before going to full weightbearing. We will also plan to do DVT prophylaxis with Lovenox bridging to Coumadin postoperatively. This  will be for duration of one month.

## 2012-12-23 NOTE — Anesthesia Procedure Notes (Signed)
Procedure Name: Intubation Date/Time: 12/23/2012 10:07 AM Performed by: Brien Mates DOBSON Pre-anesthesia Checklist: Patient identified, Emergency Drugs available, Suction available, Patient being monitored and Timeout performed Patient Re-evaluated:Patient Re-evaluated prior to inductionOxygen Delivery Method: Circle system utilized Preoxygenation: Pre-oxygenation with 100% oxygen Intubation Type: IV induction Ventilation: Mask ventilation without difficulty Laryngoscope Size: Miller and 2 Grade View: Grade I Tube type: Oral Number of attempts: 1 Airway Equipment and Method: Stylet Placement Confirmation: ETT inserted through vocal cords under direct vision Secured at: 21 cm Tube secured with: Tape Dental Injury: Teeth and Oropharynx as per pre-operative assessment

## 2012-12-23 NOTE — Consult Note (Signed)
ANTICOAGULATION CONSULT NOTE - Initial Consult  Pharmacy Consult for Coumadin Indication: VTE prophylaxis  No Known Allergies  Vital Signs: Temp: 97 F (36.1 C) (05/06 1445) Temp src: Oral (05/06 0734) BP: 144/62 mmHg (05/06 1440) Pulse Rate: 87 (05/06 1445)  Labs:  Recent Labs  12/23/12 0723  HGB 11.3*  HCT 32.9*  PLT 456*  CREATININE 1.00    CrCl is unknown because there is no height on file for the current visit.   Medical History: Past Medical History  Diagnosis Date  . Hypertension   . Depression   . Heart murmur   . Arthritis   . Fracture of tibial plateau, closed 12/23/2012    Medications:  No anticoagulants pta  Assessment: 67yof s/p ORIF left tibial plateau fracture to begin coumadin for VTE prophylaxis. No baseline INR available. Coumadin score = 4.  Goal of Therapy:  INR 2-3 Monitor platelets by anticoagulation protocol: Yes   Plan:  1) Coumadin 7.5mg  x 1 2) Daily INR 3) Coumadin education - book/video  Fredrik Rigger 12/23/2012,3:07 PM

## 2012-12-23 NOTE — Progress Notes (Signed)
Orthopedic Tech Progress Note Patient Details:  Katrina Ball 1945-09-05 [id number] Applied overhead frame to bed.     Jennye Moccasin 12/23/2012, 6:02 PM

## 2012-12-23 NOTE — Anesthesia Postprocedure Evaluation (Signed)
  Anesthesia Post-op Note  Patient: Katrina Ball  Procedure(s) Performed: Procedure(s): OPEN REDUCTION INTERNAL FIXATION (ORIF) LEFT TIBIAL PLATEAU (Left)  Patient Location: PACU  Anesthesia Type:General  Level of Consciousness: awake, oriented, sedated and patient cooperative  Airway and Oxygen Therapy: Patient Spontanous Breathing  Post-op Pain: mild  Post-op Assessment: Post-op Vital signs reviewed, Patient's Cardiovascular Status Stable, Respiratory Function Stable, Patent Airway, No signs of Nausea or vomiting and Pain level controlled  Post-op Vital Signs: stable  Complications: No apparent anesthesia complications

## 2012-12-23 NOTE — H&P (Signed)
  PREOPERATIVE H&P  Chief Complaint: Left Tibial Plateau Fracture  HPI: Katrina Ball is a 67 y.o. female who presents for preoperative history and physical with a diagnosis of Left Tibial Plateau Fracture. Symptoms are rated as moderate to severe, and have been worsening.  This is significantly impairing activities of daily living.  She has elected for surgical management. She has varus alignment, with depression of the medial plateau, and a bicondylar pattern with an intercondylar split. I discussed the options of nonsurgical management with her, given the fact that this is mildly displaced, but not terribly displaced, however I did counsel her that she has increased risk for posttraumatic arthritis, and progressive loss of position, and that surgical intervention would be a more predictable outcome to maintain alignment and prevent the risk for malunion, which could ultimately lead to the requirement for knee replacement.  Past Medical History  Diagnosis Date  . Hypertension   . Depression   . Heart murmur   . Arthritis    Past Surgical History  Procedure Laterality Date  . Tubal ligation     History   Social History  . Marital Status: Widowed    Spouse Name: N/A    Number of Children: N/A  . Years of Education: N/A   Social History Main Topics  . Smoking status: Current Every Day Smoker -- 0.25 packs/day    Types: Cigarettes  . Smokeless tobacco: None  . Alcohol Use: No  . Drug Use: No  . Sexually Active: None   Other Topics Concern  . None   Social History Narrative  . None   History reviewed. No pertinent family history. No Known Allergies Prior to Admission medications   Medication Sig Start Date End Date Taking? Authorizing Provider  aspirin EC 81 MG tablet Take 81 mg by mouth daily.   Yes Historical Provider, MD  cetirizine (ZYRTEC) 10 MG tablet Take 10 mg by mouth daily.   Yes Historical Provider, MD  FLUoxetine (PROZAC) 20 MG capsule Take 60 mg by mouth  daily.   Yes Historical Provider, MD  losartan-hydrochlorothiazide (HYZAAR) 50-12.5 MG per tablet Take 1 tablet by mouth daily.   Yes Historical Provider, MD  oxyCODONE-acetaminophen (PERCOCET/ROXICET) 5-325 MG per tablet Take 1-2 tablets by mouth every 6 (six) hours as needed for pain.   Yes Historical Provider, MD     Positive ROS: All other systems have been reviewed and were otherwise negative with the exception of those mentioned in the HPI and as above.  Physical Exam: General: Alert, no acute distress Cardiovascular: No pedal edema Respiratory: No cyanosis, no use of accessory musculature GI: No organomegaly, abdomen is soft and non-tender Skin: No lesions in the area of chief complaint Neurologic: Sensation intact distally Psychiatric: Patient is competent for consent with normal mood and affect Lymphatic: No axillary or cervical lymphadenopathy  MUSCULOSKELETAL: Left leg has soft compartments, sensation intact throughout, positive bruising and ecchymosis proximally.  Assessment: Left Tibial Plateau Fracture, bicondylar, with intracondylar split, mild varus alignment  Plan: Plan for Procedure(s): OPEN REDUCTION INTERNAL FIXATION (ORIF) LEFT TIBIAL PLATEAU  The risks benefits and alternatives were discussed with the patient including but not limited to the risks of nonoperative treatment, versus surgical intervention including infection, bleeding, nerve injury,  blood clots, cardiopulmonary complications, morbidity, mortality, among others, and they were willing to proceed.   Eulas Post, MD Cell [phone]   12/23/2012 8:49 AM

## 2012-12-23 NOTE — Progress Notes (Signed)
Katrina Ball [id number] Code Status: [place]   Admission Data: 12/23/2012 6:18 PM Attending Provider:  [place] WUJ:WJXBJY, Nolon Bussing, MD Consults/ Treatment Team:    Katrina Ball is a 67 y.o. female patient admitted from ED awake, alert - oriented  X 3 - no acute distress noted.  VSS - Blood pressure 145/68, pulse 81, temperature 97.6 F (36.4 C), temperature source Oral, resp. rate 18, height 5\' 6"  (1.676 m), weight 71.668 kg (158 lb), SpO2 94.00%.  no c/o shortness of breath, no c/o chest pain.  O2:   @ 0.5 l/min per nasal cannula. IV Fluids:  IV in place, occlusive dsg intact without redness, IV cath wrist right, condition patent and no redness none.  Allergies:  No Known Allergies   Past Medical History  Diagnosis Date  . Hypertension   . Depression   . Heart murmur   . Arthritis   . Fracture of tibial plateau, closed 12/23/2012  . Sleep apnea   . Exertional shortness of breath   . Anemia     "used to be" (12/23/2012)  . GERD (gastroesophageal reflux disease)   . Anxiety    Medications Prior to Admission  Medication Sig Dispense Refill  . aspirin EC 81 MG tablet Take 81 mg by mouth daily.      . cetirizine (ZYRTEC) 10 MG tablet Take 10 mg by mouth daily.      Marland Kitchen FLUoxetine (PROZAC) 20 MG capsule Take 60 mg by mouth daily.      Marland Kitchen losartan-hydrochlorothiazide (HYZAAR) 50-12.5 MG per tablet Take 1 tablet by mouth daily.      . [DISCONTINUED] oxyCODONE-acetaminophen (PERCOCET/ROXICET) 5-325 MG per tablet Take 1-2 tablets by mouth every 6 (six) hours as needed for pain.       History:  obtained from the patient. Tobacco/alcohol: denied none  Orientation to room, and floor completed with information packet given to patient/family.  Patient declined safety video at this time.  Admission INP armband ID verified with patient/family, and in place.   SR up x 2, fall assessment complete, with patient and family able to verbalize understanding of risk associated with falls, and  verbalized understanding to call nsg before up out of bed.  Call light within reach, patient able to voice, and demonstrate understanding.  Skin, clean-dry- intact without evidence of bruising, or skin tears.   No evidence of skin break down noted on exam.     Will cont to eval and treat per MD orders.  Orvan Seen, RN 12/23/2012 6:18 PM

## 2012-12-24 ENCOUNTER — Encounter (HOSPITAL_COMMUNITY): Payer: Self-pay | Admitting: Orthopedic Surgery

## 2012-12-24 LAB — CBC
HCT: 29.5 % — ABNORMAL LOW (ref 36.0–46.0)
Hemoglobin: 9.8 g/dL — ABNORMAL LOW (ref 12.0–15.0)
MCH: 29.4 pg (ref 26.0–34.0)
MCHC: 33.2 g/dL (ref 30.0–36.0)
MCV: 88.6 fL (ref 78.0–100.0)
Platelets: 387 10*3/uL (ref 150–400)
RBC: 3.33 MIL/uL — ABNORMAL LOW (ref 3.87–5.11)
RDW: 13.9 % (ref 11.5–15.5)
WBC: 8.4 10*3/uL (ref 4.0–10.5)

## 2012-12-24 LAB — PROTIME-INR
INR: 1.09 (ref 0.00–1.49)
Prothrombin Time: 14 seconds (ref 11.6–15.2)

## 2012-12-24 LAB — BASIC METABOLIC PANEL
BUN: 11 mg/dL (ref 6–23)
CO2: 34 mEq/L — ABNORMAL HIGH (ref 19–32)
Calcium: 9.4 mg/dL (ref 8.4–10.5)
Chloride: 93 mEq/L — ABNORMAL LOW (ref 96–112)
Creatinine, Ser: 0.92 mg/dL (ref 0.50–1.10)
GFR calc Af Amer: 74 mL/min — ABNORMAL LOW (ref >90)
GFR calc non Af Amer: 64 mL/min — ABNORMAL LOW (ref >90)
Glucose, Bld: 99 mg/dL (ref 70–99)
Potassium: 4 mEq/L (ref 3.5–5.1)
Sodium: 133 mEq/L — ABNORMAL LOW (ref 135–145)

## 2012-12-24 MED ORDER — WARFARIN SODIUM 7.5 MG PO TABS
7.5000 mg | ORAL_TABLET | Freq: Once | ORAL | Status: AC
  Administered 2012-12-24: 7.5 mg via ORAL
  Filled 2012-12-24: qty 1

## 2012-12-24 NOTE — Progress Notes (Signed)
     Subjective:  Patient reports pain as moderate.  Otherwise doing well.  Up in bed eating breakfast.  Objective:   VITALS:   Filed Vitals:   12/23/12 1900 12/23/12 2000 12/24/12 0428 12/24/12 0953  BP: 159/75 144/80 127/69 110/62  Pulse: 83 82 84   Temp: 98.3 F (36.8 C) 98.8 F (37.1 C) 98.6 F (37 C)   TempSrc: Oral Oral Oral   Resp: 18 20 18    Height:      Weight:      SpO2: 93% 94% 93%     Neurologically intact Dorsiflexion/Plantar flexion intact Knee immobilizer in place  Lab Results  Component Value Date   WBC 8.4 12/24/2012   HGB 9.8* 12/24/2012   HCT 29.5* 12/24/2012   MCV 88.6 12/24/2012   PLT 387 12/24/2012     Assessment/Plan: 1 Day Post-Op   Principal Problem:   Fracture of tibial plateau, closed  NWB lovenox bridging to coumadin Advance diet Up with therapy Discharge to SNF   Alisan Dokes P 12/24/2012, 11:11 AM   Teryl Lucy, MD Cell [phone]

## 2012-12-24 NOTE — Progress Notes (Signed)
ANTICOAGULATION CONSULT NOTE - Follow Up Consult  Pharmacy Consult for Coumadin Indication: VTE prophylaxis  No Known Allergies  Patient Measurements: Height: 5\' 6"  (167.6 cm) Weight: 158 lb (71.668 kg) IBW/kg (Calculated) : 59.3  Vital Signs: Temp: 98.6 F (37 C) (05/07 0428) Temp src: Oral (05/07 0428) BP: 127/69 mmHg (05/07 0428) Pulse Rate: 84 (05/07 0428)  Labs:  Recent Labs  12/23/12 0723 12/24/12 0635  HGB 11.3* 9.8*  HCT 32.9* 29.5*  PLT 456* 387  LABPROT  --  14.0  INR  --  1.09  CREATININE 1.00 0.92    Estimated Creatinine Clearance: 61.9 ml/min (by C-G formula based on Cr of 0.92).  Assessment: 67yof started on coumadin yesterday for VTE prophylaxis s/p ORIF left tibial plateau fracture. INR is subtherapeutic after first dose as expected. Post-op abla is stable.  Goal of Therapy:  INR 2-3 Monitor platelets by anticoagulation protocol: Yes   Plan:  1) Repeat coumadin 7.5mg  x 1 2) Follow up INR in AM  Fredrik Rigger 12/24/2012,8:16 AM

## 2012-12-24 NOTE — Evaluation (Signed)
Physical Therapy Evaluation Patient Details Name: Katrina Ball MRN: [id number] DOB: 1946/04/08 Today's Date: 12/24/2012 Time: 6213-0865 PT Time Calculation (min): 31 min  PT Assessment / Plan / Recommendation Clinical Impression  Ms. Burkhalter is a 67 y/o female s/p ORIF for L tibial plateau fracture.  Pt lives alone with no consistent caregiver available.  During PT eval pt's SpO2 dropped from 98% on 1L O2 via [place] at rest to 84% on room air with activity.  Acute PT to follow pt to promote functional independence.  Suggesting continued PT in Short SNF.  No DME needs at this time.     PT Assessment  Patient needs continued PT services    Follow Up Recommendations  SNF;Supervision/Assistance - 24 hour    Does the patient have the potential to tolerate intense rehabilitation      Barriers to Discharge Decreased caregiver support      Equipment Recommendations  None recommended by PT    Recommendations for Other Services     Frequency Min 3X/week    Precautions / Restrictions Precautions Precautions: Fall Required Braces or Orthoses: Knee Immobilizer - Left Knee Immobilizer - Left: On at all times Restrictions Weight Bearing Restrictions: Yes LLE Weight Bearing: Non weight bearing    Pertinent Vitals/Pain 6/10 pain in LLE.  Pt medicated during PT session.  SpO2 dropped from 98% on 1L O2 via [place] at rest to 84% on Room air with activity.        Mobility  Bed Mobility Bed Mobility: Supine to Sit;Sitting - Scoot to Edge of Bed Supine to Sit: 5: Supervision Sitting - Scoot to Edge of Bed: 4: Min assist Details for Bed Mobility Assistance: Min assist to support LLE.  Transfers Transfers: Sit to Stand;Stand to [employer] Transfers Sit to Stand: 4: Min guard Stand to Sit: 4: Min guard Stand Pivot Transfers: 4: Min guard Details for Transfer Assistance: VCs for sequencing.  Ambulation/Gait Ambulation/Gait Assistance: 4: Min guard Ambulation Distance (Feet): 5  Feet Assistive device: Rolling walker Ambulation/Gait Assistance Details: Pt able to demonstrate NWB on LLE>   Gait Pattern: Step-to pattern Stairs: No Wheelchair Mobility Wheelchair Mobility: No    Exercises Total Joint Exercises Ankle Circles/Pumps: 10 reps;Both   PT Diagnosis: Difficulty walking;Acute pain  PT Problem List: Decreased activity tolerance;Decreased mobility;Decreased knowledge of use of DME;Pain PT Treatment Interventions: Gait training;DME instruction;Functional mobility training;Therapeutic activities;Patient/family education;Therapeutic exercise   PT Goals Acute Rehab PT Goals PT Goal Formulation: With patient Time For Goal Achievement: 01/07/13 Potential to Achieve Goals: Good Pt will Roll Supine to Right Side: with modified independence PT Goal: Rolling Supine to Right Side - Progress: Goal set today Pt will go Supine/Side to Sit: Independently PT Goal: Supine/Side to Sit - Progress: Goal set today Pt will go Sit to Supine/Side: with modified independence PT Goal: Sit to Supine/Side - Progress: Goal set today Pt will Transfer Bed to Chair/Chair to Bed: Independently PT Transfer Goal: Bed to Chair/Chair to Bed - Progress: Goal set today Pt will Ambulate: 16 - 50 feet;with modified independence;with rolling walker PT Goal: Ambulate - Progress: Goal set today  Visit Information  Last PT Received On: 12/24/12    Subjective Data  Subjective: Agree to PT eval Patient Stated Goal: Return to home when able to walk   Prior Functioning  Home Living Lives With: Alone Available Help at Discharge: Skilled Nursing Facility Type of Home: House Home Access: Stairs to enter [employer] of Steps: 2 Entrance Stairs-Rails: None Home Layout: One  level Bathroom Shower/Tub: Tub/shower unit;Curtain Prior Function Level of Independence: Independent Able to Take Stairs?: Yes Driving: Yes Vocation: Unemployed Communication Communication: No difficulties     Cognition  Cognition Arousal/Alertness: Awake/alert Behavior During Therapy: WFL for tasks assessed/performed Overall Cognitive Status: Within Functional Limits for tasks assessed    Extremity/Trunk Assessment Right Upper Extremity Assessment RUE ROM/Strength/Tone: Within functional levels Left Upper Extremity Assessment LUE ROM/Strength/Tone: Within functional levels Right Lower Extremity Assessment RLE ROM/Strength/Tone: Within functional levels Left Lower Extremity Assessment LLE ROM/Strength/Tone: Unable to fully assess   Balance Balance Balance Assessed: No  End of Session PT - End of Session Equipment Utilized During Treatment: Gait belt;Left knee immobilizer Activity Tolerance: Patient tolerated treatment well Patient left: in chair;with call bell/phone within reach Nurse Communication: Mobility status  GP     Kolby Myung 12/24/2012, 11:46 AM Theron Arista L. Aleshka Corney DPT [phone]

## 2012-12-24 NOTE — Evaluation (Signed)
Occupational Therapy Evaluation Patient Details Name: Katrina Ball MRN: [id number] DOB: 28-Apr-1946 Today's Date: 12/24/2012 Time: 4696-2952 OT Time Calculation (min): 15 min  OT Assessment / Plan / Recommendation Clinical Impression  Pt s/p ORIF for L tibial plateau fracture.  Will benefit from continued OT services to address below problem list. Recommending [facility] to further progress rehab before returning home since pt has limited family/caregiver support.    OT Assessment  Patient needs continued OT Services    Follow Up Recommendations  SNF    Barriers to Discharge Decreased caregiver support    Equipment Recommendations  3 in 1 bedside comode    Recommendations for Other Services    Frequency  Min 2X/week    Precautions / Restrictions Precautions Precautions: Fall Required Braces or Orthoses: Knee Immobilizer - Left Knee Immobilizer - Left: On at all times Restrictions Weight Bearing Restrictions: Yes LLE Weight Bearing: Non weight bearing   Pertinent Vitals/Pain See vitals    ADL  Eating/Feeding: Independent;Performed Where Assessed - Eating/Feeding: Bed level Grooming: Performed;Set up;Wash/dry hands Where Assessed - Grooming: Unsupported sitting Toilet Transfer: Simulated;Minimal assistance Toilet Transfer Method: Sit to stand Toilet Transfer Equipment:  (bed) Toileting - Clothing Manipulation and Hygiene: Performed;Set up Where Assessed - Toileting Clothing Manipulation and Hygiene: Supine, head of bed flat;Other (comment) (bridging through R LE to lift hips) Equipment Used: Gait belt;Rolling walker Transfers/Ambulation Related to ADLs: Min assist with RW to take several side steps to [facility]. ADL Comments: Pt on bed pain upon OT arrival. Pt able to bridge through RLE in order to lift hips up off bed.  Pt able to perform peri care at bed level.  She had just returned to bed and declined OOB mobility at first but was agreeable to taking some steps to [facility] in order  to better positioned in bed.      OT Diagnosis: Generalized weakness;Acute pain  OT Problem List: Decreased strength;Decreased activity tolerance;Impaired balance (sitting and/or standing);Decreased knowledge of use of DME or AE;Decreased knowledge of precautions;Pain OT Treatment Interventions: Self-care/ADL training;DME and/or AE instruction;Therapeutic activities;Patient/family education;Balance training   OT Goals Acute Rehab OT Goals OT Goal Formulation: With patient Time For Goal Achievement: 12/31/12 Potential to Achieve Goals: Good ADL Goals Pt Will Perform Lower Body Dressing: Sit to stand from chair;Sit to stand from bed;with adaptive equipment;with supervision ADL Goal: Lower Body Dressing - Progress: Goal set today Pt Will Transfer to Toilet: with supervision;Ambulation;with DME;Comfort height toilet;Maintaining weight bearing status ADL Goal: Toilet Transfer - Progress: Goal set today Pt Will Perform Toileting - Clothing Manipulation: with supervision;Standing;Sitting on 3-in-1 or toilet ADL Goal: Toileting - Clothing Manipulation - Progress: Goal set today Pt Will Perform Toileting - Hygiene: with supervision;Standing at 3-in-1/toilet;Sit to stand from 3-in-1/toilet ADL Goal: Toileting - Hygiene - Progress: Goal set today  Visit Information  Last OT Received On: 12/24/12 Assistance Needed: +1    Subjective Data      Prior Functioning     Home Living Lives With: Alone Available Help at Discharge: Skilled Nursing Facility Type of Home: House Home Access: Stairs to enter Secretary/administrator of Steps: 2 Entrance Stairs-Rails: None Home Layout: One level Bathroom Shower/Tub: Tub/shower unit;Curtain Prior Function Level of Independence: Independent Able to Take Stairs?: Yes Driving: Yes Vocation: Unemployed Communication Communication: No difficulties         Vision/Perception     Cognition  Cognition Arousal/Alertness: Awake/alert Behavior During  Therapy: WFL for tasks assessed/performed Overall Cognitive Status: Within Functional Limits for tasks assessed  Extremity/Trunk Assessment Right Upper Extremity Assessment RUE ROM/Strength/Tone: Within functional levels Left Upper Extremity Assessment LUE ROM/Strength/Tone: Within functional levels     Mobility Bed Mobility Bed Mobility: Supine to Sit;Sitting - Scoot to Edge of Bed;Sit to Supine Supine to Sit: 5: Supervision Sitting - Scoot to Edge of Bed: 4: Min guard Sit to Supine: 4: Min assist Details for Bed Mobility Assistance: Min assist to support LLE.  Transfers Transfers: Sit to Stand;Stand to Sit Sit to Stand: 4: Min guard;From bed;With upper extremity assist Stand to Sit: 4: Min guard;To bed;With upper extremity assist Details for Transfer Assistance: VCs for safe hand placement.     Exercise     Balance     End of Session OT - End of Session Equipment Utilized During Treatment: Gait belt Activity Tolerance: Patient tolerated treatment well Patient left: in bed;with call bell/phone within reach;with family/visitor present Nurse Communication: Mobility status  GO    12/24/2012 Cipriano Mile OTR/L Pager [phone] Office [phone]  Cipriano Mile 12/24/2012, 5:01 PM

## 2012-12-25 LAB — CBC
HCT: 29.2 % — ABNORMAL LOW (ref 36.0–46.0)
Hemoglobin: 9.9 g/dL — ABNORMAL LOW (ref 12.0–15.0)
MCH: 29.6 pg (ref 26.0–34.0)
MCHC: 33.9 g/dL (ref 30.0–36.0)
MCV: 87.2 fL (ref 78.0–100.0)
Platelets: 373 10*3/uL (ref 150–400)
RBC: 3.35 MIL/uL — ABNORMAL LOW (ref 3.87–5.11)
RDW: 13.6 % (ref 11.5–15.5)
WBC: 8.2 10*3/uL (ref 4.0–10.5)

## 2012-12-25 LAB — BASIC METABOLIC PANEL
BUN: 13 mg/dL (ref 6–23)
CO2: 30 mEq/L (ref 19–32)
Calcium: 9.8 mg/dL (ref 8.4–10.5)
Chloride: 95 mEq/L — ABNORMAL LOW (ref 96–112)
Creatinine, Ser: 0.97 mg/dL (ref 0.50–1.10)
GFR calc Af Amer: 70 mL/min — ABNORMAL LOW (ref >90)
GFR calc non Af Amer: 60 mL/min — ABNORMAL LOW (ref >90)
Glucose, Bld: 97 mg/dL (ref 70–99)
Potassium: 4.4 mEq/L (ref 3.5–5.1)
Sodium: 136 mEq/L (ref 135–145)

## 2012-12-25 LAB — PROTIME-INR
INR: 1.48 (ref 0.00–1.49)
Prothrombin Time: 17.5 seconds — ABNORMAL HIGH (ref 11.6–15.2)

## 2012-12-25 MED ORDER — WARFARIN SODIUM 7.5 MG PO TABS
7.5000 mg | ORAL_TABLET | Freq: Once | ORAL | Status: AC
  Administered 2012-12-25: 7.5 mg via ORAL
  Filled 2012-12-25: qty 1

## 2012-12-25 NOTE — Progress Notes (Signed)
Physical Therapy Treatment Patient Details Name: Katrina Ball MRN: [id number] DOB: [birth year] Today's Date: 12/25/2012 Time: 9811-9147 PT Time Calculation (min): 33 min  PT Assessment / Plan / Recommendation Comments on Treatment Session  Pt able to maitain NWB well. Pt used commode during session. Continue to recommend SNF for safe DC.      Follow Up Recommendations  SNF;Supervision/Assistance - 24 hour     Does the patient have the potential to tolerate intense rehabilitation     Barriers to Discharge        Equipment Recommendations  None recommended by PT    Recommendations for Other Services    Frequency Min 3X/week   Plan Discharge plan remains appropriate;Frequency remains appropriate    Precautions / Restrictions Precautions Precautions: Fall Required Braces or Orthoses: Knee Immobilizer - Left Knee Immobilizer - Left: On at all times Restrictions Weight Bearing Restrictions: Yes (LLE) LLE Weight Bearing: Non weight bearing   Pertinent Vitals/Pain 7/10 pain in left knee    Mobility  Bed Mobility Bed Mobility: Supine to Sit Supine to Sit: 4: Min assist Sitting - Scoot to Edge of Bed: 4: Min assist Details for Bed Mobility Assistance: Min assist to support LLE.  Transfers Transfers: Sit to Stand;Stand to Sit Sit to Stand: 4: Min guard;From bed;With upper extremity assist;From chair/3-in-1 Stand to Sit: 4: Min guard;To bed;With upper extremity assist;To chair/3-in-1 Details for Transfer Assistance: VCs for safe hand placement. Ambulation/Gait Ambulation/Gait Assistance: 4: Min guard Ambulation Distance (Feet): 12 Feet (also ambulated 10 feet) Assistive device: Rolling walker General Gait Details: pt able to maintain NWB status well. Stairs: No Wheelchair Mobility Wheelchair Mobility: No    Exercises Total Joint Exercises Ankle Circles/Pumps: AROM;10 reps;Both;Supine   PT Diagnosis:    PT Problem List:   PT Treatment Interventions:     PT  Goals Acute Rehab PT Goals PT Goal Formulation: With patient Time For Goal Achievement: 01/07/13 Potential to Achieve Goals: Good Pt will Roll Supine to Right Side: with modified independence PT Goal: Rolling Supine to Right Side - Progress: Progressing toward goal Pt will go Supine/Side to Sit: Independently PT Goal: Supine/Side to Sit - Progress: Progressing toward goal Pt will go Sit to Supine/Side: with modified independence PT Goal: Sit to Supine/Side - Progress: Goal set today Pt will Transfer Bed to Chair/Chair to Bed: Independently PT Transfer Goal: Bed to Chair/Chair to Bed - Progress: Progressing toward goal Pt will Ambulate: 16 - 50 feet;with modified independence;with rolling walker PT Goal: Ambulate - Progress: Progressing toward goal  Visit Information  Last PT Received On: 12/25/12 Assistance Needed: +1    Subjective Data      Cognition  Cognition Arousal/Alertness: Awake/alert Behavior During Therapy: WFL for tasks assessed/performed Overall Cognitive Status: Within Functional Limits for tasks assessed    Balance     End of Session PT - End of Session Equipment Utilized During Treatment: Gait belt;Left knee immobilizer Activity Tolerance: Patient tolerated treatment well Patient left: in chair;with call bell/phone within reach Nurse Communication: Mobility status and pt states IV leaking at insertion site   GP     Donnella Sham 12/25/2012, 1:40 PM Lavona Mound, PT  [phone] 12/25/2012

## 2012-12-25 NOTE — Progress Notes (Signed)
Chaplain responded to spiritual care consult that pt had requested prayer. Pt had two visitors and was eating supper. I offered to come back tomorrow. Pt appreciated the offer. I will see her then.

## 2012-12-25 NOTE — Progress Notes (Signed)
     Subjective:  Patient reports pain as mild.  Doing well.  Objective:   VITALS:   Filed Vitals:   12/24/12 1851 12/24/12 2001 12/25/12 0030 12/25/12 0420  BP: 120/50 146/53 134/61 134/55  Pulse: 91 91 85 90  Temp: 98.7 F (37.1 C) 98.8 F (37.1 C) 98.8 F (37.1 C) 98.9 F (37.2 C)  TempSrc: Oral Oral    Resp: 18 17 16 17   Height:      Weight:      SpO2: 92% 91% 93% 93%    Neurologically intact Dorsiflexion/Plantar flexion intact   Lab Results  Component Value Date   WBC 8.2 12/25/2012   HGB 9.9* 12/25/2012   HCT 29.2* 12/25/2012   MCV 87.2 12/25/2012   PLT 373 12/25/2012     Assessment/Plan: 2 Days Post-Op   Principal Problem:   Fracture of tibial plateau, closed   Advance diet Up with therapy Plan for discharge tomorrow Discharge to SNF   Alif Petrak P 12/25/2012, 9:10 AM   Teryl Lucy, MD Cell [phone]

## 2012-12-25 NOTE — Progress Notes (Signed)
ANTICOAGULATION CONSULT NOTE - Follow Up Consult  Pharmacy Consult for Coumadin Indication: VTE prophylaxis  No Known Allergies  Patient Measurements: Height: 5\' 6"  (167.6 cm) Weight: 158 lb (71.668 kg) IBW/kg (Calculated) : 59.3  Vital Signs: Temp: 98.9 F (37.2 C) (05/08 0420) BP: 134/55 mmHg (05/08 0420) Pulse Rate: 90 (05/08 0420)  Labs:  Recent Labs  12/23/12 0723 12/24/12 0635 12/25/12 0612  HGB 11.3* 9.8* 9.9*  HCT 32.9* 29.5* 29.2*  PLT 456* 387 373  LABPROT  --  14.0 17.5*  INR  --  1.09 1.48  CREATININE 1.00 0.92  --     Estimated Creatinine Clearance: 61.9 ml/min (by C-G formula based on Cr of 0.92).  Assessment: 67yof continues on coumadin for VTE prophylaxis s/p ORIF left tibial plateau fracture. INR is subtherapeutic but trending up appropriately. CBC low but stable.  Goal of Therapy:  INR 2-3 Monitor platelets by anticoagulation protocol: Yes   Plan:  1) Repeat coumadin 7.5mg  x 1 2) Follow up INR in AM 3) Continue lovenox 40mg  sq q24 until INR >/= 1.8  Fredrik Rigger 12/25/2012,8:26 AM

## 2012-12-25 NOTE — Clinical Social Work Placement (Addendum)
Clinical Social Work Department  CLINICAL SOCIAL WORK PLACEMENT NOTE  12/25/2012 Patient: Katrina Ball Account Number: [id number] Admit date:  12/23/12 Clinical Social Worker: Sabino Niemann MSW Date/time: 12/25/2012 11:30 AM  Clinical Social Work is seeking post-discharge placement for this patient at the following level of care: SKILLED NURSING (*CSW will update this form in Epic as items are completed)  12/25/2012 Patient/family provided with [facility] Department of Clinical Social Work's list of facilities offering this level of care within the geographic area requested by the patient (or if unable, by the patient's family).  12/25/2012  Patient/family informed of their freedom to choose among providers that offer the needed level of care, that participate in Medicare, Medicaid or managed care program needed by the patient, have an available bed and are willing to accept the patient.  12/25/2012  Patient/family informed of [facility]' ownership interest in [facility], as well as of the fact that they are under no obligation to receive care at this facility.  PASARR submitted to EDS on  PASARR number received from EDS on  FL2 transmitted to all facilities in geographic area requested by pt/family on 12/25/2012   FL2 transmitted to all facilities within larger geographic area on  Patient informed that his/her managed care company has contracts with or will negotiate with certain facilities, including the following:  Patient/family informed of bed offers received:  Patient [facility] bed at [facility] Physician recommends and patient chooses bed at  Patient to be transferred to on 12/26/2012 Patient to be transferred to facility by  [facility] The following physician request were entered in Epic:  Additional Comments:

## 2012-12-26 ENCOUNTER — Other Ambulatory Visit: Payer: Self-pay | Admitting: *Deleted

## 2012-12-26 LAB — PROTIME-INR
INR: 1.52 — ABNORMAL HIGH (ref 0.00–1.49)
Prothrombin Time: 17.9 seconds — ABNORMAL HIGH (ref 11.6–15.2)

## 2012-12-26 MED ORDER — OXYCODONE-ACETAMINOPHEN 10-325 MG PO TABS: ORAL_TABLET | ORAL | Status: DC

## 2012-12-26 MED ORDER — WARFARIN SODIUM 7.5 MG PO TABS
7.5000 mg | ORAL_TABLET | Freq: Once | ORAL | Status: DC
  Filled 2012-12-26: qty 1

## 2012-12-26 NOTE — Progress Notes (Signed)
Patient discharged in stable condition via wheelchair to SNF. Discharge packet was given by social work.

## 2012-12-26 NOTE — Progress Notes (Signed)
Clinical social worker assisted with patient discharge to skilled nursing facility, [facility].  CSW addressed all family questions and concerns. CSW copied chart and added all important documents Clinical Social Worker will sign off for now as social work intervention is no longer needed.  Sabino Niemann, MSW, [phone]

## 2012-12-26 NOTE — Discharge Summary (Signed)
Physician Discharge Summary  Patient ID: Katrina Ball MRN: [id number] DOB/AGE: 65-Apr-1947 65 y.o.  Admit date: 12/23/2012 Discharge date: 12/26/2012  Admission Diagnoses:  Fracture of tibial plateau, closed, bicondylar  Discharge Diagnoses:  Principal Problem:   Fracture of tibial plateau, closed   Past Medical History  Diagnosis Date  . Hypertension   . Depression   . Heart murmur   . Arthritis   . Fracture of tibial plateau, closed 12/23/2012  . Sleep apnea   . Exertional shortness of breath   . Anemia     "used to be" (12/23/2012)  . GERD (gastroesophageal reflux disease)   . Anxiety     Surgeries: Procedure(s): OPEN REDUCTION INTERNAL FIXATION (ORIF) LEFT TIBIAL PLATEAU on 12/23/2012   Consultants (if any):    Discharged Condition: Improved  Hospital Course: Katrina Ball is an 67 y.o. female who was admitted 12/23/2012 with a diagnosis of Fracture of tibial plateau, closed and went to the operating room on 12/23/2012 and underwent the above named procedures.    She was given perioperative antibiotics:  Anti-infectives   Start     Dose/Rate Route Frequency Ordered Stop   12/24/12 0400  ceFAZolin (ANCEF) IVPB 2 g/50 mL premix     2 g 100 mL/hr over 30 Minutes Intravenous  Once 12/23/12 2238 12/24/12 0344   12/23/12 1600  ceFAZolin (ANCEF) IVPB 2 g/50 mL premix  Status:  Discontinued     2 g 100 mL/hr over 30 Minutes Intravenous Every 6 hours 12/23/12 1458 12/23/12 2238   12/23/12 0940  ceFAZolin (ANCEF) 2-3 GM-% IVPB SOLR    Comments:  BANKS, MARY "BETH": cabinet override      12/23/12 0940 12/23/12 2144   12/23/12 0934  ceFAZolin (ANCEF) IVPB 2 g/50 mL premix     2 g 100 mL/hr over 30 Minutes Intravenous 30 min pre-op 12/23/12 0934 12/23/12 0953    .  She was given sequential compression devices, early ambulation, and lovenox bridging to coumadin for DVT prophylaxis.  She benefited maximally from the hospital stay and there were no complications.    Recent  vital signs:  Filed Vitals:   12/26/12 0654  BP: 132/56  Pulse: 85  Temp: 98.9 F (37.2 C)  Resp: 18    Recent laboratory studies:  Lab Results  Component Value Date   HGB 9.9* 12/25/2012   HGB 9.8* 12/24/2012   HGB 11.3* 12/23/2012   Lab Results  Component Value Date   WBC 8.2 12/25/2012   PLT 373 12/25/2012   Lab Results  Component Value Date   INR 1.52* 12/26/2012   Lab Results  Component Value Date   NA 136 12/25/2012   K 4.4 12/25/2012   CL 95* 12/25/2012   CO2 30 12/25/2012   BUN 13 12/25/2012   CREATININE 0.97 12/25/2012   GLUCOSE 97 12/25/2012    Discharge Medications:     Medication List    STOP taking these medications       oxyCODONE-acetaminophen 5-325 MG per tablet  Commonly known as:  PERCOCET/ROXICET      TAKE these medications       aspirin EC 81 MG tablet  Take 81 mg by mouth daily.     cetirizine 10 MG tablet  Commonly known as:  ZYRTEC  Take 10 mg by mouth daily.     FLUoxetine 20 MG capsule  Commonly known as:  PROZAC  Take 60 mg by mouth daily.     losartan-hydrochlorothiazide 50-12.5 MG per  tablet  Commonly known as:  HYZAAR  Take 1 tablet by mouth daily.     oxyCODONE-acetaminophen 10-325 MG per tablet  Commonly known as:  PERCOCET  Take 1-2 tablets by mouth every 6 (six) hours as needed for pain. MAXIMUM TOTAL ACETAMINOPHEN DOSE IS 4000 MG PER DAY     sennosides-docusate sodium 8.6-50 MG tablet  Commonly known as:  SENOKOT-S  Take 1 tablet by mouth daily.        Diagnostic Studies: Dg Chest 2 View  12/23/2012  *RADIOLOGY REPORT*  Clinical Data: Chest pain and shortness of breath.  CHEST - 2 VIEW  Comparison: 07/22/2010  Findings: The cardiac silhouette, mediastinal and hilar contours are within normal limits and stable.  The lungs are clear.  No pleural effusion.  The bony thorax is intact.  IMPRESSION: No acute cardiopulmonary findings.   Original Report Authenticated By: Rudie Meyer, M.D.    Dg Tibia/fibula Left  12/23/2012  *RADIOLOGY  REPORT*  Clinical Data:Open reduction and internal fixation left tibia fracture.  DG C-ARM 1-60 MIN,LEFT TIBIA AND FIBULA - 2 VIEW  Fluoroscopy Time: 1 minute and 32 seconds.  Comparison: None.  Findings: Four C-arm views of the left proximal tibia submitted for review after surgery.  This reveals open reduction and internal fixation of the left tibia fracture.  No complication noted.  IMPRESSION: Post open reduction and internal fixation of the left tibia fracture.  No complication noted.   Original Report Authenticated By: Lacy Duverney, M.D.    US Venous Img Lower Unilateral Left  12/16/2012  *RADIOLOGY REPORT*  Clinical Data: Left leg pain and swelling.  LEFT LOWER EXTREMITY VENOUS DUPLEX ULTRASOUND  Technique: Gray-scale sonography with graded compression, as well as color Doppler and duplex ultrasound, were performed to evaluate the deep venous system of the left lower extremity from the level of the common femoral vein through the popliteal and proximal calf veins. Spectral Doppler was utilized to evaluate flow at rest and with distal augmentation maneuvers.  Comparison: None.  Findings:  Normal compressibility of  the left common femoral, superficial femoral, and popliteal veins is demonstrated, as well as the visualized proximal calf veins.  No filling defects to suggest DVT on grayscale or color Doppler imaging.  Doppler waveforms show normal direction of venous flow, normal respiratory phasicity and response to augmentation.  IMPRESSION: No evidence of  left lower extremity deep vein thrombosis.   Original Report Authenticated By: Beckie Salts, M.D.    Dg C-arm 1-60 Min  12/23/2012  *RADIOLOGY REPORT*  Clinical Data:Open reduction and internal fixation left tibia fracture.  DG C-ARM 1-60 MIN,LEFT TIBIA AND FIBULA - 2 VIEW  Fluoroscopy Time: 1 minute and 32 seconds.  Comparison: None.  Findings: Four C-arm views of the left proximal tibia submitted for review after surgery.  This reveals open reduction  and internal fixation of the left tibia fracture.  No complication noted.  IMPRESSION: Post open reduction and internal fixation of the left tibia fracture.  No complication noted.   Original Report Authenticated By: Lacy Duverney, M.D.     Disposition:       Discharge Orders   Future Orders Complete By Expires     Call MD / Call 911  As directed     Comments:      If you experience chest pain or shortness of breath, CALL 911 and be transported to the hospital emergency room.  If you develope a fever above 101 F, pus (white drainage) or increased drainage  or redness at the wound, or calf pain, call your surgeon's office.    Constipation Prevention  As directed     Comments:      Drink plenty of fluids.  Prune juice may be helpful.  You may use a stool softener, such as Colace (over the counter) 100 mg twice a day.  Use MiraLax (over the counter) for constipation as needed.    Diet general  As directed     Discharge instructions  As directed     Comments:      Change dressing in 3 days and reapply fresh dressing, unless you have a splint (half cast).  If you have a splint/cast, just leave in place until your follow-up appointment.    Keep wounds dry for 3 weeks.  Leave steri-strips in place on skin.  Do not apply lotion or anything to the wound.    Non weight bearing  As directed        Follow-up Information   Follow up with Eulas Post, MD. Schedule an appointment as soon as possible for a visit in 2 weeks.   Contact information:   [address] [place] [address] [phone]        Signed: Eulas Post 12/26/2012, 10:50 AM

## 2012-12-26 NOTE — Progress Notes (Signed)
ANTICOAGULATION CONSULT NOTE - Follow Up Consult  Pharmacy Consult for Coumadin Indication: VTE prophylaxis  No Known Allergies  Patient Measurements: Height: 5\' 6"  (167.6 cm) Weight: 158 lb (71.668 kg) IBW/kg (Calculated) : 59.3  Vital Signs: Temp: 98.9 F (37.2 C) (05/09 0654) BP: 132/56 mmHg (05/09 0654) Pulse Rate: 85 (05/09 0654)  Labs:  Recent Labs  12/24/12 0635 12/25/12 0612 12/26/12 0605  HGB 9.8* 9.9*  --   HCT 29.5* 29.2*  --   PLT 387 373  --   LABPROT 14.0 17.5* 17.9*  INR 1.09 1.48 1.52*  CREATININE 0.92 0.97  --     Estimated Creatinine Clearance: 58.7 ml/min (by C-G formula based on Cr of 0.97).  Assessment: 67yof continues on coumadin for VTE prophylaxis s/p ORIF left tibial plateau fracture. INR is subtherapeutic but trending up. CBC low but stable. Recommend dc on coumadin 7.5mg  po daily.    Goal of Therapy:  INR 2-3 Monitor platelets by anticoagulation protocol: Yes   Plan:  1) Repeat coumadin 7.5mg  x 1 tonight if not discharged to SNF.  2) Daily PT/INR 3) Continue lovenox 40mg  sq q24 until INR >/= 1.8  Wendie Simmer, PharmD, BCPS Clinical Pharmacist  Pager: [phone]

## 2012-12-26 NOTE — Progress Notes (Signed)
Patient ID: Katrina Ball, female   DOB: Sep 26, 1945, 67 y.o.   MRN: [id number]     Subjective:  Patient reports pain as moderate.  Patient is doing well and is ready to got to SNF  Objective:   VITALS:   Filed Vitals:   12/25/12 0420 12/25/12 1445 12/25/12 2126 12/26/12 0654  BP: 134/55 142/51 135/43 132/56  Pulse: 90 80 86 85  Temp: 98.9 F (37.2 C) 98.8 F (37.1 C) 98.7 F (37.1 C) 98.9 F (37.2 C)  TempSrc:      Resp: 17 16 18 18   Height:      Weight:      SpO2: 93% 99% 95% 99%    ABD soft Sensation intact distally Dorsiflexion/Plantar flexion intact Incision: dressing C/D/I and no drainage Dressing removed wound clean and dry no sign of infection Dry dressing reapplied    Lab Results  Component Value Date   WBC 8.2 12/25/2012   HGB 9.9* 12/25/2012   HCT 29.2* 12/25/2012   MCV 87.2 12/25/2012   PLT 373 12/25/2012     Assessment/Plan: 3 Days Post-Op   Principal Problem:   Fracture of tibial plateau, closed   Advance diet Up with therapy Discharge to SNF Continue knee immobilizer  NWB left lower ext.   Haskel Khan 12/26/2012, 9:48 AM   Teryl Lucy, MD Cell [phone]

## 2012-12-26 NOTE — Progress Notes (Signed)
Occupational Therapy Treatment Patient Details Name: Katrina Ball MRN: [id number] DOB: December 03, 1945 Today's Date: 12/26/2012 Time: 4098-1191 OT Time Calculation (min): 15 min  OT Assessment / Plan / Recommendation Comments on Treatment Session Pt progressing towards goals. Practiced LB dressing with AE and toilet transfer today.    Follow Up Recommendations  SNF    Barriers to Discharge       Equipment Recommendations  3 in 1 bedside comode    Recommendations for Other Services    Frequency Min 2X/week   Plan Discharge plan remains appropriate    Precautions / Restrictions Precautions Precautions: Fall Required Braces or Orthoses: Knee Immobilizer - Left Knee Immobilizer - Left: On at all times Restrictions Weight Bearing Restrictions: Yes LLE Weight Bearing: Non weight bearing   Pertinent Vitals/Pain Pain 7/10. Repositioned and verbalized she did not need pain meds.     ADL  Lower Body Dressing: Min guard Where Assessed - Lower Body Dressing: Supported sit to stand Toilet Transfer: Hydrographic surveyor Method: Sit to Barista: Raised toilet seat with arms (or 3-in-1 over toilet) Toileting - Clothing Manipulation and Hygiene: Min guard Where Assessed - Toileting Clothing Manipulation and Hygiene: Standing;Sit on 3-in-1 or toilet (hygiene-sitting and clothing-standing) Equipment Used: Gait belt;Knee Immobilizer;Rolling walker;Sock aid;Reacher Transfers/Ambulation Related to ADLs: Minguard ADL Comments: Pt able to don/doff sock with AE and simulated LB dressing with sit to stand transfer at Minguard level. Pt performed toilet transfer at minguard A along with clothing manipulation.    OT Diagnosis:    OT Problem List:   OT Treatment Interventions:     OT Goals Acute Rehab OT Goals OT Goal Formulation: With patient Time For Goal Achievement: 12/31/12 Potential to Achieve Goals: Good ADL Goals Pt Will Perform Lower Body Dressing: Sit to  stand from chair;Sit to stand from bed;with adaptive equipment;with supervision ADL Goal: Lower Body Dressing - Progress: Progressing toward goals Pt Will Transfer to Toilet: with supervision;Ambulation;with DME;Comfort height toilet;Maintaining weight bearing status ADL Goal: Toilet Transfer - Progress: Progressing toward goals Pt Will Perform Toileting - Clothing Manipulation: with supervision;Standing;Sitting on 3-in-1 or toilet ADL Goal: Toileting - Clothing Manipulation - Progress: Progressing toward goals Pt Will Perform Toileting - Hygiene: with supervision;Standing at 3-in-1/toilet;Sit to stand from 3-in-1/toilet ADL Goal: Toileting - Hygiene - Progress: Progressing toward goals  Visit Information  Last OT Received On: 12/26/12 Assistance Needed: +1    Subjective Data      Prior Functioning       Cognition  Cognition Arousal/Alertness: Awake/alert Behavior During Therapy: WFL for tasks assessed/performed Overall Cognitive Status: Within Functional Limits for tasks assessed    Mobility  Bed Mobility Bed Mobility: Not assessed Transfers Transfers: Sit to Stand;Stand to Sit Sit to Stand: 4: Min guard;With upper extremity assist;From chair/3-in-1 Stand to Sit: 4: Min guard;With upper extremity assist;To chair/3-in-1 Details for Transfer Assistance: minguard for safety.    Exercises      Balance     End of Session OT - End of Session Equipment Utilized During Treatment: Gait belt;Left knee immobilizer Activity Tolerance: Patient tolerated treatment well Patient left: in chair;with call bell/phone within reach  GO     Earlie Raveling OTR/L [phone] 12/26/2012, 1:18 PM

## 2012-12-26 NOTE — Progress Notes (Signed)
Clinical Social Work Department  BRIEF PSYCHOSOCIAL ASSESSMENT  Patient: Katrina Ball Account Number: [id number]  Admit date: 12/23/12 Clinical Social Worker Sabino Niemann, MSW Date/Time: 12/25/12 Referred by: Physician Date Referred: 12/24/12 Referred for   SNF Placement   Other Referral:  Interview type: Patient  Other interview type: PSYCHOSOCIAL DATA  Living Status: Alone Admitted from facility:  Level of care:  Primary support name: Siler,Rachel  Primary support relationship to patient: Daughter Degree of support available:  Strong and vested  CURRENT CONCERNS  Current Concerns   Post-Acute Placement   Other Concerns:  SOCIAL WORK ASSESSMENT / PLAN  CSW met with pt re: PT recommendation for SNF.   Pt lives alone  CSW explained placement process and answered questions.   Pt reports [facility]  as her preference    CSW completed FL2 and initiated SNF search.     Assessment/plan status: Information/Referral to [facility]  Other assessment/ plan:  Information/referral to community resources:  SNF   PTAR  PATIENT'S/FAMILY'S RESPONSE TO PLAN OF CARE:  Pt  reports she is agreeable to ST SNF in order to increase strength and independence with mobility prior to returning home  Pt verbalized understanding of placement process and appreciation for CSW assist.   Sabino Niemann, MSW [phone]

## 2013-01-01 ENCOUNTER — Non-Acute Institutional Stay (SKILLED_NURSING_FACILITY): Payer: Medicare Other | Admitting: Internal Medicine

## 2013-01-01 DIAGNOSIS — S82142S Displaced bicondylar fracture of left tibia, sequela: Secondary | ICD-10-CM

## 2013-01-01 DIAGNOSIS — J309 Allergic rhinitis, unspecified: Secondary | ICD-10-CM

## 2013-01-01 DIAGNOSIS — I10 Essential (primary) hypertension: Secondary | ICD-10-CM

## 2013-01-01 DIAGNOSIS — S8290XS Unspecified fracture of unspecified lower leg, sequela: Secondary | ICD-10-CM

## 2013-01-01 DIAGNOSIS — D62 Acute posthemorrhagic anemia: Secondary | ICD-10-CM

## 2013-01-08 ENCOUNTER — Non-Acute Institutional Stay (SKILLED_NURSING_FACILITY): Payer: Medicare Other | Admitting: Internal Medicine

## 2013-01-08 DIAGNOSIS — K5900 Constipation, unspecified: Secondary | ICD-10-CM | POA: Insufficient documentation

## 2013-01-08 DIAGNOSIS — S8290XS Unspecified fracture of unspecified lower leg, sequela: Secondary | ICD-10-CM

## 2013-01-08 DIAGNOSIS — I10 Essential (primary) hypertension: Secondary | ICD-10-CM

## 2013-01-08 DIAGNOSIS — J309 Allergic rhinitis, unspecified: Secondary | ICD-10-CM

## 2013-01-08 DIAGNOSIS — S82142S Displaced bicondylar fracture of left tibia, sequela: Secondary | ICD-10-CM

## 2013-01-08 NOTE — Progress Notes (Signed)
PROGRESS NOTE  DATE: 01/08/2013   FACILITY: [facility]  LEVEL OF CARE: SNF (31)  Discharge Visit  CHIEF COMPLAINT:  Manage left tibial plateau fracture and allergic rhinitis  HISTORY OF PRESENT ILLNESS: I was requested by the social worker to perform face-to-face evaluation for discharge:  Patient was admitted to this facility for short-term rehabilitation after the patient's recent hospitalization.  Patient has completed SNF rehabilitation and therapy has cleared the patient for discharge on 01-13-13.  Left tibial plateau fracture-patient underwent ORIF. She denies any acute problems.  ALLERGIC RHINITIS: Allergic rhinitis remains unstable.  Patient denies ongoing symptoms such as runny nose sneezing or tearing. No complications reported from the current medication(s) being used.  Patient is complaining of postnasal drip and frequent coughing spells despite being on cetrizine. Mucinex seems to help with her cough.  PAST MEDICAL HISTORY : Reviewed.  No changes.  CURRENT MEDICATIONS: Reviewed per [facility]  REVIEW OF SYSTEMS:  GENERAL: no change in appetite, no fatigue, no weight changes, no fever, chills or weakness RESPIRATORY: has cough, no SOB, DOE, wheezing, hemoptysis CARDIAC: no chest pain, edema or palpitations GI: no abdominal pain, diarrhea, constipation, heart burn, nausea or vomiting  PHYSICAL EXAMINATION  VS:  T 97.9       P 68       RR 20      BP 105/57      POX %       WT (Lb) 151.6  GENERAL: no acute distress, normal body habitus EYES: conjunctivae normal, sclerae normal, normal eye lids NECK: supple, trachea midline, no neck masses, no thyroid tenderness, no thyromegaly LYMPHATICS: no LAN in the neck, no supraclavicular LAN RESPIRATORY: breathing is even & unlabored, BS CTAB CARDIAC: RRR, no murmur,no extra heart sounds, no edema GI: abdomen soft, normal BS, no masses, no tenderness, no hepatomegaly, no splenomegaly PSYCHIATRIC: the  patient is alert & oriented to person, affect & behavior appropriate  LABS/RADIOLOGY: None in chart  ASSESSMENT/PLAN:  1.  Left tibial plateau fracture-status post ORIF. Continue with physical therapy at home. 2. allergic rhinitis-uncontrolled problem at Mucinex 600 mg twice a day. 3. hypertension-well controlled. 4. constipation-well-controlled. 5. depression-denies ongoing symptoms.  I have filled out patient's discharge paperwork and written prescriptions.  Patient will receive home health PT, OT, nursing. DME provided: Lightweight wheelchair (728.87, V15.16)  Total discharge time: Greater than 30 minutes Discharge time involved coordination of the discharge process with Child psychotherapist, nursing staff and therapy department. Medical justification for home health services/DME verified.  CPT CODE: 40981

## 2013-01-22 DIAGNOSIS — D62 Acute posthemorrhagic anemia: Secondary | ICD-10-CM | POA: Insufficient documentation

## 2013-01-22 NOTE — Progress Notes (Signed)
Patient ID: Katrina Ball, female   DOB: 07-16-1946, 67 y.o.   MRN: [id number]        HISTORY & PHYSICAL  DATE: 01/01/2013   FACILITY: [facility]  LEVEL OF CARE: SNF (31)  ALLERGIES:  No Known Allergies  CHIEF COMPLAINT:  Manage left tibial plateau fracture, acute blood loss anemia, and hypertension.    HISTORY OF PRESENT ILLNESS:  The patient is a 67 year-old, African-American female.  LEFT TIBIAL PLATEAU FRACTURE:   The patient had sustained a fracture of the left tibial plateau which was closed.  She underwent ORIF and tolerated the procedure well.  She is admitted to this facility for short-term rehabilitation.   She denies pain in her left lower extremity.    ANEMIA: Postoperatively, patient suffered acute blood loss.   The anemia has been stable. The patient denies fatigue, melena or hematochezia. She is currently not on iron.  Last hemoglobins are:   11.3, 9.8, 9.9.    HTN: Pt 's HTN remains stable.  Denies CP, sob, DOE, pedal edema, headaches, dizziness or visual disturbances.  No complications from the medications currently being used.  Last BP :  174/74.  PAST MEDICAL HISTORY :  Past Medical History  Diagnosis Date  . Hypertension   . Depression   . Heart murmur   . Arthritis   . Fracture of tibial plateau, closed 12/23/2012  . Sleep apnea   . Exertional shortness of breath   . Anemia     "used to be" (12/23/2012)  . GERD (gastroesophageal reflux disease)   . Anxiety     PAST SURGICAL HISTORY: Past Surgical History  Procedure Laterality Date  . Tubal ligation    . Incision and drainage of wound  12/07/2012    "LLE" (12/23/2012)  . Orif proximal tibial plateau fracture Left 12/23/2012  . Orif tibia plateau Left 12/23/2012    Procedure: OPEN REDUCTION INTERNAL FIXATION (ORIF) LEFT TIBIAL PLATEAU;  Surgeon: Eulas Post, MD;  Location: [facility] OR;  Service: Orthopedics;  Laterality: Left;    SOCIAL HISTORY:  reports that she has been smoking  Cigarettes.  She has a 11.25 pack-year smoking history. She has never used smokeless tobacco. She reports that she drinks about 5.4 ounces of alcohol per week. She reports that she does not use illicit drugs.  FAMILY HISTORY: None  CURRENT MEDICATIONS: Reviewed per [facility]  REVIEW OF SYSTEMS:   CHEST/RESPIRATORY:  Complains of productive cough and postnasal drip.    See HPI otherwise 14 point ROS is negative.  PHYSICAL EXAMINATION  VS:  T 97       P 70      RR 18      BP 174/74      POX%        WT (Lb) 154.4  GENERAL: no acute distress, normal body habitus SKIN: warm & dry, no suspicious lesions or rashes, no excessive dryness, left lower extremity is in a brace EYES: conjunctivae normal, sclerae normal, normal eye lids MOUTH/THROAT: lips without lesions,no lesions in the mouth,tongue is without lesions,uvula elevates in midline NECK: supple, trachea midline, no neck masses, no thyroid tenderness, no thyromegaly LYMPHATICS: no LAN in the neck, no supraclavicular LAN RESPIRATORY: breathing is even & unlabored, BS CTAB CARDIAC: RRR, no murmur,no extra heart sounds, no edema GI:  ABDOMEN: abdomen soft, normal BS, no masses, no tenderness  LIVER/SPLEEN: no hepatomegaly, no splenomegaly MUSCULOSKELETAL: HEAD: normal to inspection & palpation BACK: no kyphosis, scoliosis or  spinal processes tenderness EXTREMITIES: LEFT UPPER EXTREMITY: full range of motion, normal strength & tone RIGHT UPPER EXTREMITY:  full range of motion, normal strength & tone LEFT LOWER EXTREMITY: strength intact, range of motion not tested, she has a brace RIGHT LOWER EXTREMITY:  full range of motion, normal strength & tone PSYCHIATRIC: the patient is alert & oriented to person, affect & behavior appropriate  LABS/RADIOLOGY: Hemoglobin 9.9, otherwise CBC normal.    INR 1.52.  BMP normal.   Chest x-ray:  No acute disease.   Left tibia/fibula x-ray postsurgically showed ORIF.    Left lower extremity venous  doppler ultrasound:  Negative for acute DVT.    ASSESSMENT/PLAN:  Left tibial plateau fracture, closed.  Status post ORIF.  Continue rehabilitation.   Hypertension.  Blood pressure unstable.  We will review a log.   Acute blood loss anemia.  Reassess hemoglobin level.   Allergic rhinitis.  Start Mucinex 600 mg b.i.d. for one week.   Currently on Zyrtec.    Depression.  No acute issues.    Check CBC and BMP.   I have reviewed patient's medical records received at admission/from hospitalization.  CPT CODE: 40981

## 2014-08-20 DIAGNOSIS — R011 Cardiac murmur, unspecified: Secondary | ICD-10-CM

## 2014-08-20 HISTORY — DX: Cardiac murmur, unspecified: R01.1

## 2014-12-01 DIAGNOSIS — I499 Cardiac arrhythmia, unspecified: Secondary | ICD-10-CM | POA: Diagnosis not present

## 2014-12-23 ENCOUNTER — Ambulatory Visit (INDEPENDENT_AMBULATORY_CARE_PROVIDER_SITE_OTHER): Payer: Medicare Other | Admitting: Cardiology

## 2014-12-23 ENCOUNTER — Encounter: Payer: Self-pay | Admitting: Cardiology

## 2014-12-23 VITALS — BP 132/76 | HR 91 | Ht 66.0 in | Wt 147.1 lb

## 2014-12-23 DIAGNOSIS — I491 Atrial premature depolarization: Secondary | ICD-10-CM

## 2014-12-23 DIAGNOSIS — I493 Ventricular premature depolarization: Secondary | ICD-10-CM

## 2014-12-23 DIAGNOSIS — R0602 Shortness of breath: Secondary | ICD-10-CM | POA: Diagnosis not present

## 2014-12-23 DIAGNOSIS — R079 Chest pain, unspecified: Secondary | ICD-10-CM

## 2014-12-23 DIAGNOSIS — I10 Essential (primary) hypertension: Secondary | ICD-10-CM | POA: Diagnosis not present

## 2014-12-23 DIAGNOSIS — J449 Chronic obstructive pulmonary disease, unspecified: Secondary | ICD-10-CM | POA: Insufficient documentation

## 2014-12-23 DIAGNOSIS — H1132 Conjunctival hemorrhage, left eye: Secondary | ICD-10-CM | POA: Diagnosis not present

## 2014-12-23 NOTE — Progress Notes (Signed)
Cardiology Office Note   Date:  12/23/2014   ID:  Katrina Ball, Katrina Ball Dec 12, 1945, MRN [id number]  PCP:  Katrina Almas, MD    Chief Complaint  Patient presents with  . New Evaluation    cardiac arrhythmia      History of Present Illness: Katrina Ball is a 69 y.o. female who presents for evaluation of abnormal EKG in PCP office that showed PAC's.  The patient recently saw her PCP for tingling in her fingers for the past few weeks.  She denied any symptoms of stroke but admitted to a few episodes of chest pain in the past few months which were nonexertional.  EKG done at that time showed PAC's.  She says that the numbness in her hands occurs in both arms but more in her right had.  Occasionally she will have an aching sensation in her left arm.  The left arm pain is not associated with when she gets the chest discomfort.  She says that the chest discomfort in her chest does not radiate.  There is no associated diaphoresis but did feel nauseated when she got the numbness in her hands.  She has chronic SOB and has been diagnosed with COPD.  She thinks that her SOB is fairly stable.  She denies any dizziness or syncope.  She thinks that her feet may swell sometimes.      Past Medical History  Diagnosis Date  . Hypertension   . Depression   . Heart murmur   . Arthritis   . Fracture of tibial plateau, closed 12/23/2012  . Sleep apnea   . Exertional shortness of breath   . Anemia     "used to be" (12/23/2012)  . GERD (gastroesophageal reflux disease)   . Anxiety   . Arrhythmia   . Chest pain   . Numbness and tingling in hands   . COPD (chronic obstructive pulmonary disease)     Past Surgical History  Procedure Laterality Date  . Tubal ligation    . Incision and drainage of wound  12/07/2012    "LLE" (12/23/2012)  . Orif proximal tibial plateau fracture Left 12/23/2012  . Orif tibia plateau Left 12/23/2012    Procedure: OPEN REDUCTION INTERNAL FIXATION (ORIF) LEFT TIBIAL  PLATEAU;  Surgeon: Johnny Bridge, MD;  Location: [facility];  Service: Orthopedics;  Laterality: Left;     Current Outpatient Prescriptions  Medication Sig Dispense Refill  . [name];[name] Inhale 2 puffs into the lungs as needed. Shortness of breathe    . [name] Take 2.5 mg by nebulization as needed. wheezing    . cetirizine (ZYRTEC) 10 MG tablet Take 10 mg by mouth daily.    Marland Kitchen FLUoxetine (PROZAC) 20 MG capsule Take 60 mg by mouth daily.    Marland Kitchen guaiFENesin-codeine (ROBITUSSIN AC) 100-10 MG/5ML syrup Take 10-100 mg by mouth as needed. For cough    . losartan-hydrochlorothiazide (HYZAAR) 50-12.5 MG per tablet Take 1 tablet by mouth daily.     No current facility-administered medications for this visit.    Allergies:   Review of patient's allergies indicates no known allergies.    Social History:  The patient  reports that she has been smoking Cigarettes.  She has a 11.25 pack-year smoking history. She has never used smokeless tobacco. She reports that she drinks about 5.4 oz of alcohol per week. She reports that she does not use  illicit drugs.   Family History:  The patient's family history includes CVA in her mother; Heart disease in her mother; Heart failure in her mother.    ROS:  Please see the history of present illness.   Otherwise, review of systems are positive for none.   All other systems are reviewed and negative.    PHYSICAL EXAM: VS:  BP 132/76 mmHg  Pulse 91  Ht '5\' 6"'$  (1.676 m)  Wt 147 lb 1.9 oz (66.733 kg)  BMI 23.76 kg/m2 , BMI Body mass index is 23.76 kg/(m^2). GEN: Well nourished, well developed, in no acute distress HEENT: normal Neck: no JVD, carotid bruits, or masses Cardiac: RRR; no murmurs, rubs, or gallops,no edema.  Occasional ectopy Respiratory:  clear to auscultation bilaterally, normal work of breathing GI: soft, nontender, nondistended, + BS MS: no  deformity or atrophy Skin: warm and dry, no rash Neuro:  Strength and sensation are intact Psych: euthymic mood, full affect   EKG:  EKG is ordered today. The ekg ordered today demonstrates NSR with PAC's and PVC's with septal infarct   Recent Labs: No results found for requested labs within last 365 days.    Lipid Panel No results found for: CHOL, TRIG, HDL, CHOLHDL, VLDL, LDLCALC, LDLDIRECT    Wt Readings from Last 3 Encounters:  12/23/14 147 lb 1.9 oz (66.733 kg)  12/23/12 158 lb (71.668 kg)        ASSESSMENT AND PLAN:  1.  PAC's - I reassured her that these are benign 2.  PVC's - check 2D echo to assess LVF 3.  Chest pain with CRF including post menopausal state, family history of heart disease, HTN and ongoing tobacco use.  I will get a stress myoview to rule out ischemia.   4.  SOB with a history of COPD 5.  Ongoing tobacco use   Current medicines are reviewed at length with the patient today.  The patient does not have concerns regarding medicines.  The following changes have been made:  no change  Labs/ tests ordered today include: see above assessment and plan  Orders Placed This Encounter  Procedures  . Myocardial Perfusion Imaging  . EKG 12-Lead  . Echocardiogram     Disposition:   FU with me PRN pending results of studies  Signed, Sueanne Margarita, MD  12/23/2014 3:26 PM    [facility] [address], [place],   [id number] Phone: [phone]; Fax: [phone]

## 2014-12-23 NOTE — Patient Instructions (Signed)
Medication Instructions:  Your physician recommends that you continue on your current medications as directed. Please refer to the Current Medication list given to you today.   Labwork: None  Testing/Procedures: Your physician has requested that you have an echocardiogram. Echocardiography is a painless test that uses sound waves to create images of your heart. It provides your doctor with information about the size and shape of your heart and how well your heart's chambers and valves are working. This procedure takes approximately one hour. There are no restrictions for this procedure.  Dr. Radford Pax recommends you have an [place].  Follow-Up: Your physician recommends that you schedule a follow-up appointment AS NEEDED with Dr. Radford Pax pending your study results.   Any Other Special Instructions Will Be Listed Below (If Applicable).

## 2015-01-04 ENCOUNTER — Telehealth (HOSPITAL_COMMUNITY): Payer: Self-pay

## 2015-01-04 NOTE — Telephone Encounter (Signed)
Patient given detailed instructions per Myocardial Perfusion Study Information Sheet for test on 01-05-2015 at 9:30am. Patient verbalized understanding. Oletta Lamas, Jemya Depierro A

## 2015-01-05 ENCOUNTER — Ambulatory Visit (HOSPITAL_BASED_OUTPATIENT_CLINIC_OR_DEPARTMENT_OTHER): Payer: Medicare Other

## 2015-01-05 ENCOUNTER — Ambulatory Visit (HOSPITAL_COMMUNITY): Payer: Medicare Other | Attending: Cardiovascular Disease

## 2015-01-05 ENCOUNTER — Other Ambulatory Visit: Payer: Self-pay

## 2015-01-05 ENCOUNTER — Encounter (HOSPITAL_COMMUNITY): Payer: Self-pay

## 2015-01-05 ENCOUNTER — Telehealth: Payer: Self-pay

## 2015-01-05 DIAGNOSIS — R0602 Shortness of breath: Secondary | ICD-10-CM

## 2015-01-05 DIAGNOSIS — R079 Chest pain, unspecified: Secondary | ICD-10-CM | POA: Diagnosis not present

## 2015-01-05 DIAGNOSIS — R002 Palpitations: Secondary | ICD-10-CM | POA: Insufficient documentation

## 2015-01-05 DIAGNOSIS — I10 Essential (primary) hypertension: Secondary | ICD-10-CM | POA: Diagnosis not present

## 2015-01-05 DIAGNOSIS — G4730 Sleep apnea, unspecified: Secondary | ICD-10-CM | POA: Insufficient documentation

## 2015-01-05 DIAGNOSIS — I272 Pulmonary hypertension, unspecified: Secondary | ICD-10-CM

## 2015-01-05 LAB — MYOCARDIAL PERFUSION IMAGING
Estimated workload: 4.1 METS
Exercise duration (min): 1 min
Exercise duration (sec): 45 s
LV dias vol: 50 mL
LV sys vol: 9 mL
MPHR: 153 {beats}/min
Nuc Stress EF: 81 %
Peak HR: 120 {beats}/min
Percent HR: 85 %
Percent of predicted max HR: 78 %
RATE: 0.34
RPE: 18
Rest HR: 75 {beats}/min
SDS: 2
SRS: 2
SSS: 4
Stage 1 DBP: 57 mmHg
Stage 1 Grade: 0 %
Stage 1 HR: 88 {beats}/min
Stage 1 SBP: 126 mmHg
Stage 1 Speed: 0 mph
Stage 2 Grade: 0 %
Stage 2 HR: 88 {beats}/min
Stage 2 Speed: 0 mph
Stage 3 Grade: 10 %
Stage 3 HR: 120 {beats}/min
Stage 3 Speed: 1.7 mph
Stage 4 Grade: 0 %
Stage 4 HR: 131 {beats}/min
Stage 4 Speed: 0 mph
Stage 5 DBP: 54 mmHg
Stage 5 Grade: 0 %
Stage 5 HR: 90 {beats}/min
Stage 5 SBP: 151 mmHg
Stage 5 Speed: 0 mph
TID: 0.99

## 2015-01-05 MED ORDER — REGADENOSON 0.4 MG/5ML IV SOLN
0.4000 mg | Freq: Once | INTRAVENOUS | Status: AC
  Administered 2015-01-05: 0.4 mg via INTRAVENOUS

## 2015-01-05 MED ORDER — PERFLUTREN LIPID MICROSPHERE
2.0000 mL | Freq: Once | INTRAVENOUS | Status: AC
  Administered 2015-01-05: 2 mL via INTRAVENOUS

## 2015-01-05 MED ORDER — TECHNETIUM TC 99M SESTAMIBI GENERIC - CARDIOLITE
11.0000 | Freq: Once | INTRAVENOUS | Status: AC | PRN
  Administered 2015-01-05: 11 via INTRAVENOUS

## 2015-01-05 MED ORDER — TECHNETIUM TC 99M SESTAMIBI GENERIC - CARDIOLITE
33.0000 | Freq: Once | INTRAVENOUS | Status: AC | PRN
  Administered 2015-01-05: 33 via INTRAVENOUS

## 2015-01-05 NOTE — Telephone Encounter (Signed)
-----   Message from Sueanne Margarita, MD sent at 01/05/2015  1:11 PM EDT ----- Please let patient know that echo showed normal LVF with moderately leaky TV and moderate pulmonary HTN.  She has COPD which is most likely the cause of pulmonary HTN but need to rule out chronic PE.  Please order a Chest CT angio to rule out PE and refer to Dr. Aundra Dubin for right heart cath

## 2015-01-05 NOTE — Progress Notes (Signed)
When trying to sign the [facility] order today, I was not given the option or prompt to select the ordering physician .The order was signed by Dr. Sallyanne Kuster in error instead of given me the option to select per protocol with cosigned required and to enter Dr. [name]. Oletta Lamas, Janiylah Hannis A

## 2015-01-05 NOTE — Telephone Encounter (Signed)
Informed patient of results and verbal understanding expressed.   Chest CT angio and referral for Dr. Aundra Dubin placed for scheduling. Patient agrees with treatment plan.

## 2015-01-11 ENCOUNTER — Telehealth: Payer: Self-pay | Admitting: Cardiology

## 2015-01-11 NOTE — Telephone Encounter (Signed)
New problem   Pt wanting results from her test she had done on 5.18.16. Please call pt.

## 2015-01-11 NOTE — Telephone Encounter (Signed)
Reviewed ECHO results with patient again. Instructed her [facility] will call her to schedule chest CTA. Reminder message sent to [facility] to schedule.

## 2015-01-13 ENCOUNTER — Ambulatory Visit (INDEPENDENT_AMBULATORY_CARE_PROVIDER_SITE_OTHER)
Admission: RE | Admit: 2015-01-13 | Discharge: 2015-01-13 | Disposition: A | Discharge status: Home / Self Care | Payer: Medicare Other | Source: Ambulatory Visit | Attending: Cardiology | Admitting: Cardiology

## 2015-01-13 DIAGNOSIS — I270 Primary pulmonary hypertension: Secondary | ICD-10-CM | POA: Diagnosis not present

## 2015-01-13 DIAGNOSIS — I10 Essential (primary) hypertension: Secondary | ICD-10-CM | POA: Diagnosis not present

## 2015-01-13 DIAGNOSIS — I272 Pulmonary hypertension, unspecified: Secondary | ICD-10-CM

## 2015-01-13 DIAGNOSIS — J984 Other disorders of lung: Secondary | ICD-10-CM | POA: Diagnosis not present

## 2015-01-13 DIAGNOSIS — I2510 Atherosclerotic heart disease of native coronary artery without angina pectoris: Secondary | ICD-10-CM | POA: Diagnosis not present

## 2015-01-13 MED ORDER — IOHEXOL 350 MG/ML SOLN
80.0000 mL | Freq: Once | INTRAVENOUS | Status: AC | PRN
  Administered 2015-01-13: 80 mL via INTRAVENOUS

## 2015-01-21 ENCOUNTER — Telehealth: Payer: Self-pay | Admitting: Cardiology

## 2015-01-21 DIAGNOSIS — I700 Atherosclerosis of aorta: Secondary | ICD-10-CM

## 2015-01-21 DIAGNOSIS — R079 Chest pain, unspecified: Secondary | ICD-10-CM

## 2015-01-21 MED ORDER — ASPIRIN EC 81 MG PO TBEC: 81.0000 mg | DELAYED_RELEASE_TABLET | Freq: Every day | ORAL | Status: DC

## 2015-01-21 NOTE — Telephone Encounter (Signed)
Informed patient of results and verbal understanding expressed.  Instructed patient to START ASPIRIN 81 mg daily. Fasting labs ordered for 01/24/15. Cardiac MRI ordered for scheduling. Patient agrees with treatment plan.

## 2015-01-21 NOTE — Telephone Encounter (Signed)
New message  ° ° °Patient calling for test results.   °

## 2015-01-21 NOTE — Addendum Note (Signed)
Addended by: Harland German A on: 01/21/2015 07:13 PM   Modules accepted: Orders

## 2015-01-21 NOTE — Telephone Encounter (Signed)
-----   Message from Sueanne Margarita, MD sent at 01/16/2015 12:20 PM EDT ----- Please go ahead and order Cardiac MRI with morphology and Radiology will assess whether her rod in her leg is compatible with MRI

## 2015-01-24 ENCOUNTER — Telehealth: Payer: Self-pay | Admitting: Cardiology

## 2015-01-24 ENCOUNTER — Other Ambulatory Visit (INDEPENDENT_AMBULATORY_CARE_PROVIDER_SITE_OTHER): Payer: Medicare Other | Admitting: *Deleted

## 2015-01-24 DIAGNOSIS — R079 Chest pain, unspecified: Secondary | ICD-10-CM

## 2015-01-24 DIAGNOSIS — I700 Atherosclerosis of aorta: Secondary | ICD-10-CM

## 2015-01-24 LAB — LIPID PANEL
Cholesterol: 227 mg/dL — ABNORMAL HIGH (ref 0–200)
HDL: 75.1 mg/dL (ref >39.00)
LDL Cholesterol: 137 mg/dL — ABNORMAL HIGH (ref 0–99)
NonHDL: 151.9
Total CHOL/HDL Ratio: 3
Triglycerides: 75 mg/dL (ref 0.0–149.0)
VLDL: 15 mg/dL (ref 0.0–40.0)

## 2015-01-24 LAB — ALT: ALT: 12 U/L (ref 0–35)

## 2015-01-24 NOTE — Telephone Encounter (Signed)
Left message to call back  

## 2015-01-24 NOTE — Telephone Encounter (Signed)
Pt has questions regarding appt on the 02/02/15. Please call her home number for contact.Katrina Ball

## 2015-01-27 MED ORDER — SIMVASTATIN 20 MG PO TABS: 20.0000 mg | ORAL_TABLET | Freq: Every day | ORAL | Status: DC

## 2015-01-27 NOTE — Telephone Encounter (Signed)
Informed patient her OV next week will be [facility].  Informed patient of lab results and verbal understanding expressed.   Instructed patient to START SIMVASTATIN 20 mg daily. Letter will be sent to patient for fasting lab appointment reminder per her request.

## 2015-01-27 NOTE — Telephone Encounter (Signed)
-----   Message from Sueanne Margarita, MD sent at 01/26/2015 12:04 PM EDT ----- Add simvastatin '20mg'$  daily and check FLP and ALT in 6 weeks.  LDL goal less than 70 due thoracic aortic calcifications

## 2015-02-01 ENCOUNTER — Telehealth: Payer: Self-pay | Admitting: Cardiology

## 2015-02-01 NOTE — Telephone Encounter (Signed)
New message      Pt is due to have a test on Friday---she has questions

## 2015-02-01 NOTE — Telephone Encounter (Signed)
Patient does not have a test on Friday, but has a Cardiac MRI on Thursday. Patient wanted to make sure what time and where to go. Patient wanted to know why she was having another MRI. Informed patient that she had a CT and from those results Dr. Radford Pax wanted her to have the Cardiac MRI. Patient verbalized understanding.

## 2015-02-02 ENCOUNTER — Ambulatory Visit (HOSPITAL_COMMUNITY)
Admission: RE | Admit: 2015-02-02 | Discharge: 2015-02-02 | Disposition: A | Discharge status: Home / Self Care | Payer: Medicare Other | Source: Ambulatory Visit | Attending: Cardiology | Admitting: Cardiology

## 2015-02-02 ENCOUNTER — Encounter (HOSPITAL_COMMUNITY): Payer: Self-pay

## 2015-02-02 VITALS — BP 132/70 | HR 100 | Wt 148.8 lb

## 2015-02-02 DIAGNOSIS — R0602 Shortness of breath: Secondary | ICD-10-CM

## 2015-02-02 DIAGNOSIS — I272 Other secondary pulmonary hypertension: Secondary | ICD-10-CM | POA: Diagnosis not present

## 2015-02-02 DIAGNOSIS — J438 Other emphysema: Secondary | ICD-10-CM

## 2015-02-02 DIAGNOSIS — I270 Primary pulmonary hypertension: Secondary | ICD-10-CM | POA: Diagnosis not present

## 2015-02-02 DIAGNOSIS — R0609 Other forms of dyspnea: Secondary | ICD-10-CM | POA: Diagnosis not present

## 2015-02-02 DIAGNOSIS — F17200 Nicotine dependence, unspecified, uncomplicated: Secondary | ICD-10-CM | POA: Insufficient documentation

## 2015-02-02 MED ORDER — BUPROPION HCL ER (XL) 150 MG PO TB24: 150.0000 mg | ORAL_TABLET | Freq: Two times a day (BID) | ORAL | Status: DC

## 2015-02-02 NOTE — Progress Notes (Signed)
Advanced Heart Failure Medication Review by a Pharmacist  Does the patient  feel that his/her medications are working for him/her?  yes  Has the patient been experiencing any side effects to the medications prescribed?  no  Does the patient measure his/her own blood pressure or blood glucose at home?  no   Does the patient have any problems obtaining medications due to transportation or finances?   no  Understanding of regimen: good Understanding of indications: good Potential of compliance: good    Pharmacist comments: Patient presents to HF clinic and medications were reviewed with a pharmacist. No medication discrepancies noted. Patient states that she was recently prescribed simvastatin and has not picked it up yet from the pharmacy. Discussed her lipid panel from this month and the benefits of taking statin medication.   Katrina Ball E. Chai Routh, Pharm.D Clinical Pharmacy Resident Pager: [phone] 02/02/2015 11:42 AM

## 2015-02-02 NOTE — Patient Instructions (Signed)
Right heart catheterization scheduled for Thursday, June 30th. See instruction sheet for details.  Will schedule you for pulmonary function test at [facility].  START Wellbutrin 150 mg tablet once daily X 3 days, then twice daily continuously.  Follow up 6 weeks.  Do the following things EVERYDAY: 1) Weigh yourself in the morning before breakfast. Write it down and keep it in a log. 2) Take your medicines as prescribed 3) Eat low salt foods-Limit salt (sodium) to 2000 mg per day.  4) Stay as active as you can everyday 5) Limit all fluids for the day to less than 2 liters

## 2015-02-02 NOTE — Progress Notes (Signed)
Patient ID: Katrina Ball, female   DOB: 1945/12/10, 69 y.o.   MRN: [id number] PCP: Dr Kellie Shropshire Primary Cardiologist: Dr Radford Pax  69 yo with history of smoking, HTN, and hyperlipidemia was initially referred to cardiology for numbness of her 3rd and 4th fingers on the right hand as well as exertional dyspnea.  She is short of breath walking < 1 block or walking up a flight of steps.  No orthopnea/PND.  No chest pain.  She has occasional indigestion and belching.  No palpitations.  She has no prior cardiac history.  She continues to smoke 5 cigarettes/day.   Workup of dyspnea so far includes a Lexiscan Cardiolite showing no ischemia or infarction and an echo with normal EF but mild to moderate pulmonary hypertension.  CTA chest showed no PE but possibly some pericardial thickening.  She was sent to this clinic for evaluation of the pulmonary hypertension noted by echo in the setting of exertional dyspnea.   ECG (5/16): NSR, PACs  Labs (6/16): LDL 137  PMH:  1. COPD: Suspected. Patient is a long-time smoker.  2. HTN 3. Hyperlipidemia 4. PACs 5. Aortic atherosclerosis on CT 6. Depression 7. Exertional dyspnea - Echo (5/16) with EF 55-60%, moderate TR, PA systolic pressure 49 mmHg.  - CTA chest (6/16): No acute or chronic PE, possible pericardial thickening.  - Lexiscan Cardiolite (6/16): EF > 65%, no ischemia or infarction.   SH: Smokes about 5 cigarettes/day (heavier prior), occasional ETOH.  Lives in [place].   FH: CVA, CHF  ROS: All systems reviewed and negative except as per HPI.   Current Outpatient Prescriptions  Medication Sig Dispense Refill  . [name];[name] Inhale 2 puffs into the lungs as needed. Shortness of breathe    . [name] Take 2.5 mg by nebulization as needed. wheezing    . aspirin EC 81 MG tablet Take 1 tablet (81 mg total) by mouth daily. 90 tablet 3   . cetirizine (ZYRTEC) 10 MG tablet Take 10 mg by mouth daily.    Marland Kitchen FLUoxetine (PROZAC) 20 MG capsule Take 60 mg by mouth daily.    Marland Kitchen losartan-hydrochlorothiazide (HYZAAR) 50-12.5 MG per tablet Take 1 tablet by mouth daily.    Marland Kitchen buPROPion (WELLBUTRIN XL) 150 MG 24 hr tablet Take 1 tablet (150 mg total) by mouth 2 (two) times daily. 60 tablet 6  . guaiFENesin-codeine (ROBITUSSIN AC) 100-10 MG/5ML syrup Take 10-100 mg by mouth as needed. For cough    . simvastatin (ZOCOR) 20 MG tablet Take 1 tablet (20 mg total) by mouth at bedtime. (Patient not taking: Reported on 67/15/2016) 30 tablet 6   No current facility-administered medications for this encounter.   BP 132/70 mmHg  Pulse 100  Wt 148 lb 12 oz (67.473 kg)  SpO2 95% General: NAD Neck: No JVD, no thyromegaly or thyroid nodule.  Lungs: Decreased breaths sounds globally. CV: Nondisplaced PMI.  Heart regular S1/S2, no S3/S4, no murmur.  No peripheral edema.  No carotid bruit.  Normal pedal pulses.  Abdomen: Soft, nontender, no hepatosplenomegaly, no distention.  Skin: Intact without lesions or rashes.  Neurologic: Alert and oriented x 3.  Psych: Normal affect. Extremities: No clubbing or cyanosis.  HEENT: Normal.   Assessment/Plan: 1. Pulmonary hypertension: Patient was noted to have mild to moderate pulmonary hypertension by echo.  CTA chest did not show evidence for acute or chronic PE.  There was not significant interstitial lung  disease present.  Patient does have a significant smoking history.  The most likely causes of the elevation in PA pressure would be group 2 pulmonary hypertension (pulmonary venous hypertension) or group 3 pulmonary hypertension from COPD.  However, cannot at this time fully rule out group 1 PH.   - I think that she needs a right heart cath to sort out the above question.  We discussed the risks/benefits of the procedure today and she agrees to proceed with it.  - She needs PFTs to assess for COPD as the cause of  her dyspnea.  2. Dyspnea with exertion: She is not volume overloaded.  I will work her up for pulmonary hypertension as above.  I think that her symptoms are most likely due to COPD (will be getting PFTs).   3. Thickened pericardium: Localized pericardial thickening noted on CTA chest.  Noted that she is scheduled for cardiac MRI to evaluate.  4. Smoking: I strongly encouraged her to quit.  She is going to try Wellbutrin for smoking cessation.   [name] 02/02/2015

## 2015-02-03 ENCOUNTER — Other Ambulatory Visit: Payer: Self-pay | Admitting: *Deleted

## 2015-02-03 ENCOUNTER — Ambulatory Visit (HOSPITAL_COMMUNITY)
Admission: RE | Admit: 2015-02-03 | Discharge: 2015-02-03 | Disposition: A | Discharge status: Home / Self Care | Payer: Medicare Other | Source: Ambulatory Visit | Attending: Cardiology | Admitting: Cardiology

## 2015-02-03 DIAGNOSIS — E785 Hyperlipidemia, unspecified: Secondary | ICD-10-CM | POA: Insufficient documentation

## 2015-02-03 DIAGNOSIS — R079 Chest pain, unspecified: Secondary | ICD-10-CM | POA: Diagnosis not present

## 2015-02-03 DIAGNOSIS — I081 Rheumatic disorders of both mitral and tricuspid valves: Secondary | ICD-10-CM | POA: Insufficient documentation

## 2015-02-03 DIAGNOSIS — Z79899 Other long term (current) drug therapy: Secondary | ICD-10-CM | POA: Diagnosis not present

## 2015-02-03 DIAGNOSIS — I313 Pericardial effusion (noninflammatory): Secondary | ICD-10-CM | POA: Insufficient documentation

## 2015-02-03 DIAGNOSIS — I491 Atrial premature depolarization: Secondary | ICD-10-CM | POA: Insufficient documentation

## 2015-02-03 DIAGNOSIS — I272 Other secondary pulmonary hypertension: Secondary | ICD-10-CM | POA: Insufficient documentation

## 2015-02-03 DIAGNOSIS — I10 Essential (primary) hypertension: Secondary | ICD-10-CM | POA: Diagnosis not present

## 2015-02-03 DIAGNOSIS — Z87891 Personal history of nicotine dependence: Secondary | ICD-10-CM | POA: Diagnosis not present

## 2015-02-03 LAB — CREATININE, SERUM
Creatinine, Ser: 1.16 mg/dL — ABNORMAL HIGH (ref 0.44–1.00)
GFR calc Af Amer: 55 mL/min — ABNORMAL LOW (ref >60)
GFR calc non Af Amer: 48 mL/min — ABNORMAL LOW (ref >60)

## 2015-02-03 MED ORDER — GADOBENATE DIMEGLUMINE 529 MG/ML IV SOLN
25.0000 mL | Freq: Once | INTRAVENOUS | Status: AC | PRN
  Administered 2015-02-03: 25 mL via INTRAVENOUS

## 2015-02-07 ENCOUNTER — Encounter: Payer: Self-pay | Admitting: Cardiology

## 2015-02-07 NOTE — Telephone Encounter (Signed)
New Message  Pt calling about lab results. Please call back and discuss.

## 2015-02-07 NOTE — Telephone Encounter (Signed)
This encounter was created in error - please disregard.

## 2015-02-09 ENCOUNTER — Telehealth (HOSPITAL_COMMUNITY): Payer: Self-pay | Admitting: Cardiology

## 2015-02-09 NOTE — Telephone Encounter (Signed)
Pt scheduled for RHC on 02/17/15 with [place] code [id number] With pts current insurance- no pre cert is required

## 2015-02-14 ENCOUNTER — Encounter (HOSPITAL_COMMUNITY): Payer: Medicare Other

## 2015-02-16 ENCOUNTER — Ambulatory Visit (HOSPITAL_COMMUNITY)
Admission: RE | Admit: 2015-02-16 | Discharge: 2015-02-16 | Disposition: A | Discharge status: Home / Self Care | Payer: Medicare Other | Source: Ambulatory Visit | Attending: Cardiology | Admitting: Cardiology

## 2015-02-16 DIAGNOSIS — R0602 Shortness of breath: Secondary | ICD-10-CM | POA: Diagnosis not present

## 2015-02-16 LAB — PULMONARY FUNCTION TEST
DL/VA % pred: 65 %
DL/VA: 3.07 ml/min/mmHg/L
DLCO unc % pred: 37 %
DLCO unc: 8.54 ml/min/mmHg
FEF 25-75 Post: 0.32 L/sec
FEF 25-75 Pre: 0.29 L/sec
FEF2575-%Change-Post: 11 %
FEF2575-%Pred-Post: 18 %
FEF2575-%Pred-Pre: 16 %
FEV1-%Change-Post: 0 %
FEV1-%Pred-Post: 47 %
FEV1-%Pred-Pre: 47 %
FEV1-Post: 0.87 L
FEV1-Pre: 0.86 L
FEV1FVC-%Change-Post: 2 %
FEV1FVC-%Pred-Pre: 65 %
FEV6-%Change-Post: 3 %
FEV6-%Pred-Post: 70 %
FEV6-%Pred-Pre: 68 %
FEV6-Post: 1.59 L
FEV6-Pre: 1.54 L
FEV6FVC-%Change-Post: 5 %
FEV6FVC-%Pred-Post: 100 %
FEV6FVC-%Pred-Pre: 94 %
FVC-%Change-Post: -2 %
FVC-%Pred-Post: 70 %
FVC-%Pred-Pre: 72 %
FVC-Post: 1.66 L
FVC-Pre: 1.7 L
Post FEV1/FVC ratio: 52 %
Post FEV6/FVC ratio: 96 %
Pre FEV1/FVC ratio: 51 %
Pre FEV6/FVC Ratio: 91 %
RV % pred: 143 %
RV: 3 L
TLC % pred: 95 %
TLC: 4.7 L

## 2015-02-16 MED ORDER — ALBUTEROL SULFATE (2.5 MG/3ML) 0.083% IN NEBU
2.5000 mg | INHALATION_SOLUTION | Freq: Once | RESPIRATORY_TRACT | Status: AC
  Administered 2015-02-16: 2.5 mg via RESPIRATORY_TRACT

## 2015-02-17 ENCOUNTER — Ambulatory Visit (HOSPITAL_COMMUNITY)
Admission: RE | Admit: 2015-02-17 | Discharge: 2015-02-17 | Disposition: A | Discharge status: Home / Self Care | Payer: Medicare Other | Source: Ambulatory Visit | Attending: Cardiology | Admitting: Cardiology

## 2015-02-17 ENCOUNTER — Encounter (HOSPITAL_COMMUNITY): Payer: Self-pay | Admitting: Cardiology

## 2015-02-17 ENCOUNTER — Encounter (HOSPITAL_COMMUNITY)
Admission: RE | Disposition: A | Discharge status: Home / Self Care | Payer: Medicare Other | Source: Ambulatory Visit | Attending: Cardiology

## 2015-02-17 DIAGNOSIS — F329 Major depressive disorder, single episode, unspecified: Secondary | ICD-10-CM | POA: Diagnosis not present

## 2015-02-17 DIAGNOSIS — I10 Essential (primary) hypertension: Secondary | ICD-10-CM | POA: Diagnosis not present

## 2015-02-17 DIAGNOSIS — I272 Other secondary pulmonary hypertension: Secondary | ICD-10-CM | POA: Insufficient documentation

## 2015-02-17 DIAGNOSIS — Z7982 Long term (current) use of aspirin: Secondary | ICD-10-CM | POA: Insufficient documentation

## 2015-02-17 DIAGNOSIS — I270 Primary pulmonary hypertension: Secondary | ICD-10-CM | POA: Diagnosis not present

## 2015-02-17 DIAGNOSIS — E785 Hyperlipidemia, unspecified: Secondary | ICD-10-CM | POA: Diagnosis not present

## 2015-02-17 DIAGNOSIS — F17210 Nicotine dependence, cigarettes, uncomplicated: Secondary | ICD-10-CM | POA: Diagnosis not present

## 2015-02-17 HISTORY — PX: CARDIAC CATHETERIZATION: SHX172

## 2015-02-17 LAB — BASIC METABOLIC PANEL
Anion gap: 6 (ref 5–15)
BUN: 15 mg/dL (ref 6–20)
CO2: 28 mmol/L (ref 22–32)
Calcium: 9.5 mg/dL (ref 8.9–10.3)
Chloride: 101 mmol/L (ref 101–111)
Creatinine, Ser: 1.2 mg/dL — ABNORMAL HIGH (ref 0.44–1.00)
GFR calc Af Amer: 53 mL/min — ABNORMAL LOW (ref >60)
GFR calc non Af Amer: 46 mL/min — ABNORMAL LOW (ref >60)
Glucose, Bld: 114 mg/dL — ABNORMAL HIGH (ref 65–99)
Potassium: 3.7 mmol/L (ref 3.5–5.1)
Sodium: 135 mmol/L (ref 135–145)

## 2015-02-17 LAB — CBC
HCT: 40.2 % (ref 36.0–46.0)
Hemoglobin: 13 g/dL (ref 12.0–15.0)
MCH: 29 pg (ref 26.0–34.0)
MCHC: 32.3 g/dL (ref 30.0–36.0)
MCV: 89.5 fL (ref 78.0–100.0)
Platelets: 239 10*3/uL (ref 150–400)
RBC: 4.49 MIL/uL (ref 3.87–5.11)
RDW: 15.9 % — ABNORMAL HIGH (ref 11.5–15.5)
WBC: 6.2 10*3/uL (ref 4.0–10.5)

## 2015-02-17 LAB — PROTIME-INR
INR: 1.02 (ref 0.00–1.49)
Prothrombin Time: 13.6 seconds (ref 11.6–15.2)

## 2015-02-17 SURGERY — RIGHT HEART CATH

## 2015-02-17 MED ORDER — FENTANYL CITRATE (PF) 100 MCG/2ML IJ SOLN
INTRAMUSCULAR | Status: AC
  Filled 2015-02-17: qty 2

## 2015-02-17 MED ORDER — SODIUM CHLORIDE 0.9 % IJ SOLN: 3.0000 mL | Freq: Two times a day (BID) | INTRAMUSCULAR | Status: DC

## 2015-02-17 MED ORDER — FENTANYL CITRATE (PF) 100 MCG/2ML IJ SOLN
INTRAMUSCULAR | Status: DC | PRN
  Administered 2015-02-17: 25 ug via INTRAVENOUS

## 2015-02-17 MED ORDER — SODIUM CHLORIDE 0.9 % IJ SOLN: 3.0000 mL | INTRAMUSCULAR | Status: DC | PRN

## 2015-02-17 MED ORDER — SODIUM CHLORIDE 0.9 % IV SOLN: 250.0000 mL | INTRAVENOUS | Status: DC | PRN

## 2015-02-17 MED ORDER — ACETAMINOPHEN 325 MG PO TABS: 650.0000 mg | ORAL_TABLET | ORAL | Status: DC | PRN

## 2015-02-17 MED ORDER — LIDOCAINE HCL (PF) 1 % IJ SOLN
INTRAMUSCULAR | Status: AC
  Filled 2015-02-17: qty 30

## 2015-02-17 MED ORDER — HEPARIN (PORCINE) IN NACL 2-0.9 UNIT/ML-% IJ SOLN
INTRAMUSCULAR | Status: AC
  Filled 2015-02-17: qty 1000

## 2015-02-17 MED ORDER — ONDANSETRON HCL 4 MG/2ML IJ SOLN: 4.0000 mg | Freq: Four times a day (QID) | INTRAMUSCULAR | Status: DC | PRN

## 2015-02-17 MED ORDER — ASPIRIN 81 MG PO CHEW
CHEWABLE_TABLET | ORAL | Status: AC
  Filled 2015-02-17: qty 1

## 2015-02-17 MED ORDER — LIDOCAINE HCL (PF) 1 % IJ SOLN
INTRAMUSCULAR | Status: DC | PRN
  Administered 2015-02-17: 2 mL via SUBCUTANEOUS

## 2015-02-17 MED ORDER — MIDAZOLAM HCL 2 MG/2ML IJ SOLN
INTRAMUSCULAR | Status: AC
  Filled 2015-02-17: qty 2

## 2015-02-17 MED ORDER — MIDAZOLAM HCL 2 MG/2ML IJ SOLN
INTRAMUSCULAR | Status: DC | PRN
  Administered 2015-02-17: 0.5 mg via INTRAVENOUS

## 2015-02-17 MED ORDER — SODIUM CHLORIDE 0.9 % IV SOLN
INTRAVENOUS | Status: DC
  Administered 2015-02-17: 07:00:00 via INTRAVENOUS

## 2015-02-17 MED ORDER — ASPIRIN 81 MG PO CHEW
81.0000 mg | CHEWABLE_TABLET | ORAL | Status: AC
  Administered 2015-02-17: 81 mg via ORAL

## 2015-02-17 SURGICAL SUPPLY — 5 items
CATH BALLN WEDGE 5F 110CM (CATHETERS) ×2 IMPLANT
KIT HEART LEFT (KITS) ×2 IMPLANT
PACK CARDIAC CATHETERIZATION (CUSTOM PROCEDURE TRAY) ×2 IMPLANT
SHEATH FAST CATH BRACH 5F 5CM (SHEATH) ×2 IMPLANT
TRANSDUCER W/STOPCOCK (MISCELLANEOUS) ×2 IMPLANT

## 2015-02-17 NOTE — Discharge Instructions (Signed)

## 2015-02-17 NOTE — H&P (View-Only) (Signed)
Patient ID: Katrina Ball, female   DOB: 1946-02-13, 69 y.o.   MRN: [id number] PCP: Dr Kellie Shropshire Primary Cardiologist: Dr Radford Pax  69 yo with history of smoking, HTN, and hyperlipidemia was initially referred to cardiology for numbness of her 3rd and 4th fingers on the right hand as well as exertional dyspnea.  She is short of breath walking < 1 block or walking up a flight of steps.  No orthopnea/PND.  No chest pain.  She has occasional indigestion and belching.  No palpitations.  She has no prior cardiac history.  She continues to smoke 5 cigarettes/day.   Workup of dyspnea so far includes a Lexiscan Cardiolite showing no ischemia or infarction and an echo with normal EF but mild to moderate pulmonary hypertension.  CTA chest showed no PE but possibly some pericardial thickening.  She was sent to this clinic for evaluation of the pulmonary hypertension noted by echo in the setting of exertional dyspnea.   ECG (5/16): NSR, PACs  Labs (6/16): LDL 137  PMH:  1. COPD: Suspected. Patient is a long-time smoker.  2. HTN 3. Hyperlipidemia 4. PACs 5. Aortic atherosclerosis on CT 6. Depression 7. Exertional dyspnea - Echo (5/16) with EF 55-60%, moderate TR, PA systolic pressure 49 mmHg.  - CTA chest (6/16): No acute or chronic PE, possible pericardial thickening.  - Lexiscan Cardiolite (6/16): EF > 65%, no ischemia or infarction.   SH: Smokes about 5 cigarettes/day (heavier prior), occasional ETOH.  Lives in [place].   FH: CVA, CHF  ROS: All systems reviewed and negative except as per HPI.   Current Outpatient Prescriptions  Medication Sig Dispense Refill  . [name];[name] Inhale 2 puffs into the lungs as needed. Shortness of breathe    . [name] Take 2.5 mg by nebulization as needed. wheezing    . aspirin EC 81 MG tablet Take 1 tablet (81 mg total) by mouth daily. 90 tablet 3   . cetirizine (ZYRTEC) 10 MG tablet Take 10 mg by mouth daily.    Marland Kitchen FLUoxetine (PROZAC) 20 MG capsule Take 60 mg by mouth daily.    Marland Kitchen losartan-hydrochlorothiazide (HYZAAR) 50-12.5 MG per tablet Take 1 tablet by mouth daily.    Marland Kitchen buPROPion (WELLBUTRIN XL) 150 MG 24 hr tablet Take 1 tablet (150 mg total) by mouth 2 (two) times daily. 60 tablet 6  . guaiFENesin-codeine (ROBITUSSIN AC) 100-10 MG/5ML syrup Take 10-100 mg by mouth as needed. For cough    . simvastatin (ZOCOR) 20 MG tablet Take 1 tablet (20 mg total) by mouth at bedtime. (Patient not taking: Reported on 02/02/2015) 30 tablet 6   No current facility-administered medications for this encounter.   BP 132/70 mmHg  Pulse 100  Wt 148 lb 12 oz (67.473 kg)  SpO2 95% General: NAD Neck: No JVD, no thyromegaly or thyroid nodule.  Lungs: Decreased breaths sounds globally. CV: Nondisplaced PMI.  Heart regular S1/S2, no S3/S4, no murmur.  No peripheral edema.  No carotid bruit.  Normal pedal pulses.  Abdomen: Soft, nontender, no hepatosplenomegaly, no distention.  Skin: Intact without lesions or rashes.  Neurologic: Alert and oriented x 3.  Psych: Normal affect. Extremities: No clubbing or cyanosis.  HEENT: Normal.   Assessment/Plan: 1. Pulmonary hypertension: Patient was noted to have mild to moderate pulmonary hypertension by echo.  CTA chest did not show evidence for acute or chronic PE.  There was not significant interstitial lung  disease present.  Patient does have a significant smoking history.  The most likely causes of the elevation in PA pressure would be group 2 pulmonary hypertension (pulmonary venous hypertension) or group 3 pulmonary hypertension from COPD.  However, cannot at this time fully rule out group 1 PH.   - I think that she needs a right heart cath to sort out the above question.  We discussed the risks/benefits of the procedure today and she agrees to proceed with it.  - She needs PFTs to assess for COPD as the cause of  her dyspnea.  2. Dyspnea with exertion: She is not volume overloaded.  I will work her up for pulmonary hypertension as above.  I think that her symptoms are most likely due to COPD (will be getting PFTs).   3. Thickened pericardium: Localized pericardial thickening noted on CTA chest.  Noted that she is scheduled for cardiac MRI to evaluate.  4. Smoking: I strongly encouraged her to quit.  She is going to try Wellbutrin for smoking cessation.   [name] 02/02/2015

## 2015-02-17 NOTE — Interval H&P Note (Signed)
History and Physical Interval Note:  02/17/2015 7:47 AM  Katrina Ball  has presented today for surgery, with the diagnosis of hf  The various methods of treatment have been discussed with the patient and family. After consideration of risks, benefits and other options for treatment, the patient has consented to  Procedure(s): Right Heart Cath (N/A) as a surgical intervention .  The patient's history has been reviewed, patient examined, no change in status, stable for surgery.  I have reviewed the patient's chart and labs.  Questions were answered to the patient's satisfaction.     Norton Bivins [employer]

## 2015-02-18 LAB — POCT I-STAT 3, VENOUS BLOOD GAS (G3P V)
Acid-base deficit: 1 mmol/L (ref 0.0–2.0)
Bicarbonate: 26.8 mEq/L — ABNORMAL HIGH (ref 20.0–24.0)
O2 Saturation: 71 %
TCO2: 28 mmol/L (ref 0–100)
pCO2, Ven: 53.9 mmHg — ABNORMAL HIGH (ref 45.0–50.0)
pH, Ven: 7.306 — ABNORMAL HIGH (ref 7.250–7.300)
pO2, Ven: 42 mmHg (ref 30.0–45.0)

## 2015-02-18 MED FILL — Heparin Sodium (Porcine) 2 Unit/ML in Sodium Chloride 0.9%: INTRAMUSCULAR | Qty: 1000 | Status: AC

## 2015-02-18 MED FILL — Heparin Sodium (Porcine) 2 Unit/ML in Sodium Chloride 0.9%: INTRAMUSCULAR | Qty: 1500 | Status: AC

## 2015-02-25 ENCOUNTER — Telehealth (HOSPITAL_COMMUNITY): Payer: Self-pay | Admitting: *Deleted

## 2015-02-25 NOTE — Telephone Encounter (Signed)
Pt aware, but would like to discuss w/MD prior to seeing pulm MD, she is sch to see [place] back on7/27

## 2015-02-25 NOTE — Telephone Encounter (Signed)
-----   Message from Larey Dresser, MD sent at 02/20/2015  9:33 AM EDT ----- Severe COPD by PFTs.  Would recommend pulmonary appointment, can arrange if not already done.

## 2015-03-16 ENCOUNTER — Ambulatory Visit (HOSPITAL_COMMUNITY)
Admission: RE | Admit: 2015-03-16 | Discharge: 2015-03-16 | Disposition: A | Discharge status: Home / Self Care | Payer: Medicare Other | Source: Ambulatory Visit | Attending: Internal Medicine | Admitting: Internal Medicine

## 2015-03-16 ENCOUNTER — Encounter (HOSPITAL_COMMUNITY): Payer: Self-pay

## 2015-03-16 VITALS — BP 102/52 | HR 85 | Wt 147.8 lb

## 2015-03-16 DIAGNOSIS — I272 Other secondary pulmonary hypertension: Secondary | ICD-10-CM | POA: Diagnosis not present

## 2015-03-16 DIAGNOSIS — I270 Primary pulmonary hypertension: Secondary | ICD-10-CM

## 2015-03-16 DIAGNOSIS — J438 Other emphysema: Secondary | ICD-10-CM | POA: Diagnosis not present

## 2015-03-16 MED ORDER — BUPROPION HCL ER (XL) 150 MG PO TB24: 150.0000 mg | ORAL_TABLET | Freq: Two times a day (BID) | ORAL | Status: DC

## 2015-03-16 MED ORDER — IPRATROPIUM BROMIDE HFA 17 MCG/ACT IN AERS: 2.0000 | INHALATION_SPRAY | Freq: Four times a day (QID) | RESPIRATORY_TRACT | Status: DC | PRN

## 2015-03-16 MED ORDER — SIMVASTATIN 20 MG PO TABS: 20.0000 mg | ORAL_TABLET | Freq: Every day | ORAL | Status: DC

## 2015-03-16 NOTE — Progress Notes (Signed)
Patient ID: Katrina Ball, female   DOB: [birth year], 69 y.o.   MRN: [id number] PCP: Dr Kellie Shropshire Primary Cardiologist: Dr Radford Pax HF/PAH: Dr Aundra Dubin  69 yo with history of smoking, HTN, and hyperlipidemia was initially referred to cardiology for numbness of her 3rd and 4th fingers on the right hand as well as exertional dyspnea.    Workup of dyspnea so far includes a Lexiscan Cardiolite showing no ischemia or infarction and an echo with normal EF but mild to moderate pulmonary hypertension.  CTA chest showed no PE but possibly some pericardial thickening.  She was sent to this clinic for evaluation of the pulmonary hypertension noted by echo in the setting of exertional dyspnea.   She returns today for pulmonary HTN follow up.  She had RHC and PFTs as below since last visit.Continues to have short of breath walking < 1 block. Not as bad walking up a flight of steps. No orthopnea/PND.  No chest pain.  Has to sit down and catch her breath after getting up from bed to go to the bathroom. Continues to have occasional indigestion and belching.  No palpitations.  She has no prior cardiac history.  She continues to smoke up to 6 cigarettes/day.   ECG (5/16): NSR, PACs  Labs (6/16): LDL 137  PMH:  1. COPD: Severe. Patient is a long-time smoker.  PFTs (6/16) with FVC 72%, FEV1 47%, ratio 65%, TLC 95%.  2. HTN 3. Hyperlipidemia 4. PACs 5. Aortic atherosclerosis on CT 6. Depression 7. Exertional dyspnea - Echo (5/16) with EF 55-60%, moderate TR, PA systolic pressure 49 mmHg.  - CTA chest (6/16): No acute or chronic PE, possible pericardial thickening.  - Lexiscan Cardiolite (6/16): EF > 65%, no ischemia or infarction.  - RHC (6/16) with mean RA 6, PA 42/24 mean 32, mean PCWP 16, CI 2.69, PVR 3.4 WU. - Cardiac MRI (6/16) with LV EF 68%, RV EF 64%, moderate TR, mild MR, localized anterior pericardial thickening without evidence for constriction.   SH: Smokes about 5 cigarettes/day (heavier  prior), occasional ETOH.  Lives in [place].   FH: CVA, CHF  ROS: All systems reviewed and negative except as per HPI.   Current Outpatient Prescriptions  Medication Sig Dispense Refill  . [name];[name] Inhale 2 puffs into the lungs as needed. Shortness of breathe    . [name] Take 2.5 mg by nebulization as needed. wheezing    . aspirin EC 81 MG tablet Take 1 tablet (81 mg total) by mouth daily. 90 tablet 3  . cetirizine (ZYRTEC) 10 MG tablet Take 10 mg by mouth daily.    Marland Kitchen FLUoxetine (PROZAC) 20 MG capsule Take 60 mg by mouth daily.    Marland Kitchen guaiFENesin-codeine (ROBITUSSIN AC) 100-10 MG/5ML syrup Take 10-100 mg by mouth as needed. For cough    . losartan-hydrochlorothiazide (HYZAAR) 50-12.5 MG per tablet Take 1 tablet by mouth daily.    Marland Kitchen buPROPion (WELLBUTRIN XL) 150 MG 24 hr tablet Take 1 tablet (150 mg total) by mouth 2 (two) times daily. (Patient not taking: Reported on 03/16/2015) 60 tablet 6  . simvastatin (ZOCOR) 20 MG tablet Take 1 tablet (20 mg total) by mouth at bedtime. (Patient not taking: Reported on 03/16/2015) 30 tablet 6   No current facility-administered medications for this encounter.   BP 102/52 mmHg  Pulse 85  Wt 147 lb 12.8 oz (67.042 kg)  SpO2 95% General: NAD Neck:  No JVD, no thyromegaly or thyroid nodule.  Lungs: Decreased breaths sounds globally. CV: Nondisplaced PMI.  Heart regular S1/S2, no S3/S4, no murmur.  No peripheral edema.  No carotid bruit.  Normal pedal pulses.  Abdomen: Soft, nontender, no hepatosplenomegaly, no distention.  Skin: Intact without lesions or rashes.  Neurologic: Alert and oriented x 3.  Psych: Normal affect. Extremities: No clubbing or cyanosis.  HEENT: Normal.   Assessment/Plan: 1. Pulmonary hypertension: Patient was noted to have mild to moderate pulmonary hypertension by echo.  CTA chest did not show evidence for acute or  chronic PE.  There was not significant interstitial lung disease present.   -Patient does have a significant smoking history.   -Based on RHC, most likely causes of the elevated PA pressure is group 2 pulmonary hypertension (pulmonary venous hypertension) + group 3 pulmonary hypertension from COPD.  No role for pulmonary vasodilators.    2. Dyspnea with exertion: She is not volume overloaded.  Think that her symptoms are most likely due to COPD which was severe by recent PFTs.   3. Thickened pericardium: Localized pericardial thickening noted on CTA chest.  This was evaluated by cardiac MRI which also showed localized anterior pericardial thickening, no constriction.  This may be a sequela of prior pericarditis.  4. Smoking: Strongly encouraged her to quit.  She still wants to try Wellbutrin for smoking cessation.   Shirley Friar  PA-C 03/16/2015   Patient seen with PA, agree with the above note.   She had mild pulmonary hypertension by echo.  I suspect this was a mixture of pulmonary venous hypertension (group 2) and pulmonary hypertension due to COPD (group 3).  No indication for selective pulmonary vasodilators.   I suspect that most of her dyspnea is due to COPD.  This appeared severe on PFTs.   - I would like her to start Atrovent qid (does not have insurance coverage to get Spiriva).   - She has appt with pulmonary.   Needs to quit smoking => she is going to try Wellbutrin.   [name] 03/17/2015

## 2015-03-16 NOTE — Patient Instructions (Signed)
START Simvastatin '20mg'$  once daily.  START Atrovent inhaler every 6 hours as needed.  START Wellbutrin.  Will refer you to pulmonary. Address: [address], [place], [place] [address]  [phone]  Return in 2 months for lab work.  Follow up 6 months.  Do the following things EVERYDAY: 1) Weigh yourself in the morning before breakfast. Write it down and keep it in a log. 2) Take your medicines as prescribed 3) Eat low salt foods-Limit salt (sodium) to 2000 mg per day.  4) Stay as active as you can everyday 5) Limit all fluids for the day to less than 2 liters

## 2015-03-31 ENCOUNTER — Institutional Professional Consult (permissible substitution): Payer: Medicare Other | Admitting: Pulmonary Disease

## 2015-04-18 ENCOUNTER — Ambulatory Visit (INDEPENDENT_AMBULATORY_CARE_PROVIDER_SITE_OTHER): Payer: Medicare Other | Admitting: Pulmonary Disease

## 2015-04-18 ENCOUNTER — Encounter: Payer: Self-pay | Admitting: Pulmonary Disease

## 2015-04-18 VITALS — BP 126/76 | HR 96 | Ht 63.0 in | Wt 147.4 lb

## 2015-04-18 DIAGNOSIS — J438 Other emphysema: Secondary | ICD-10-CM | POA: Diagnosis not present

## 2015-04-18 MED ORDER — TIOTROPIUM BROMIDE MONOHYDRATE 18 MCG IN CAPS: 18.0000 ug | ORAL_CAPSULE | Freq: Every day | RESPIRATORY_TRACT | Status: DC

## 2015-04-18 NOTE — Patient Instructions (Addendum)
Stop smoking Take Spiriva once a day no matter how you feel Exercise regularly Find out which pneumonia shot you had and get the other one  For the sinuses: Use Neil Med rinses with distilled water at least twice per day using the instructions on the package. 1/2 hour after using the [facility] Med rinse, use Nasacort two puffs in each nostril once per day.  Remember that the Nasacort can take 1-2 weeks to work after regular use. Use generic zyrtec (cetirizine) every day.  If this doesn't help, then stop taking it and use chlorpheniramine-phenylephrine combination tablets.   We will see you back in 6-8 weeks or sooner if needed

## 2015-04-18 NOTE — Assessment & Plan Note (Addendum)
COPD: GOLD Stage 3, Grade C Combined recommendations from the [employer], [employer], Investment banker, corporate, [place] (Qaseem A et al, Ann Intern Med. 2011;155(3):179) recommends tobacco cessation, pulmonary rehab (for symptomatic patients with an FEV1 < 50% predicted), supplemental oxygen (for patients with SaO2 <88% or paO2 <55), and appropriate bronchodilator therapy.  In regards to long acting bronchodilators, they recommend monotherapy (FEV1 60-80% with symptoms weak evidence, FEV1 with symptoms <60% strong evidence), or combination therapy (FEV1 <60% with symptoms, strong recommendation, moderate evidence).  One should also provide patients with annual immunizations and consider therapy for prevention of COPD exacerbations (ie. roflumilast or azithromycin) when appopriate.  -O2 therapy: Walk today to check for oxygen need -Immunizations: She will check with the health department to find out which pneumonia vaccine she received -Tobacco use: Advised at length to quit smoking -Exercise: Advised at length to start exercising -Bronchodilator therapy: Start Spiriva, continue as needed albuterol -Exacerbation prevention: Quit smoking, Spiriva

## 2015-04-18 NOTE — Progress Notes (Signed)
Subjective:    Patient ID: Katrina Ball, female    DOB: 04/21/1946, 69 y.o.   MRN: [id number]  HPI Chief Complaint  Patient presents with  . Advice Only    refer Dr. Aundra Dubin for abnormal PFT. Pt c/o occas DOE, prod cough at times (clear phlem), nasal cong, PND.    This is a very pleasant 69 year old lady who comes to see me today because she has recently been diagnosed with COPD. She said that before this year she was unaware of the diagnosis. She had no respiratory illnesses as a child and she has no family history of lung diseases. However, she has smoked one half pack of cigarettes daily since age 69. Apparently she quit for 2 years. She is currently smoking 5-8 cigarettes a day. She's never been hospitalized for a respiratory problem. Earlier this year she was evaluated by cardiology when she had some numbness in her hand. She was found to have pulmonary hypertension based on a right heart catheterization as detailed below. She had an extensive workup which revealed evidence of severe COPD.  She tells me that she gets shortness of breath with exertion particularly in hot weather or if she's around certain chemicals, dust, smoke, or fumes. She says that she can carry in groceries but if she carries too much makes her short of breath. Carrying laundry is difficult. She does have a cough which is worse primarily due to sinus congestion. She has a significant amount of allergic rhinitis. She currently is taking Zyrtec alone for this. She has albuterol which she uses on an as-needed basis and this helps with her breathing. She denies significant sputum production outside of when she has bad allergic rhinitis.       Past Medical History  Diagnosis Date  . Hypertension   . Depression   . Heart murmur   . Arthritis   . Fracture of tibial plateau, closed 12/23/2012  . Sleep apnea   . Exertional shortness of breath   . Anemia     "used to be" (12/23/2012)  . GERD (gastroesophageal reflux  disease)   . Anxiety   . Arrhythmia   . Chest pain   . Numbness and tingling in hands   . COPD (chronic obstructive pulmonary disease)      Family History  Problem Relation Age of Onset  . CVA Mother   . Heart failure Mother   . Heart disease Mother   . Asthma Mother      Social History   Social History  . Marital Status: Widowed    Spouse Name: N/A  . Number of Children: 3  . Years of Education: N/A   Occupational History  . retired    Social History Main Topics  . Smoking status: Current Every Day Smoker -- 0.25 packs/day for 45 years    Types: Cigarettes  . Smokeless tobacco: Never Used     Comment: 3-8 cigs a day 04/18/15  . Alcohol Use: 5.4 oz/week    3 Cans of beer, 6 Shots of liquor per week     Comment: 12/23/2012 "alcohol intake varys greatly"  . Drug Use: No  . Sexual Activity: No   Other Topics Concern  . Not on file   Social History Narrative     No Known Allergies   Outpatient Prescriptions Prior to Visit  Medication Sig Dispense Refill  . [name];[name] Inhale 2 puffs into the lungs as needed. Shortness  of breathe    . [name] Take 2.5 mg by nebulization as needed. wheezing    . aspirin EC 81 MG tablet Take 1 tablet (81 mg total) by mouth daily. 90 tablet 3  . buPROPion (WELLBUTRIN XL) 150 MG 24 hr tablet Take 1 tablet (150 mg total) by mouth 2 (two) times daily. 60 tablet 6  . cetirizine (ZYRTEC) 10 MG tablet Take 10 mg by mouth daily.    Marland Kitchen FLUoxetine (PROZAC) 20 MG capsule Take 60 mg by mouth daily.    Marland Kitchen guaiFENesin-codeine (ROBITUSSIN AC) 100-10 MG/5ML syrup Take 10-100 mg by mouth as needed. For cough    . losartan-hydrochlorothiazide (HYZAAR) 50-12.5 MG per tablet Take 1 tablet by mouth daily.    . simvastatin (ZOCOR) 20 MG tablet Take 1 tablet (20 mg total) by mouth at bedtime. 30 tablet 6  . ipratropium (ATROVENT HFA) 17 MCG/ACT inhaler  Inhale 2 puffs into the lungs every 6 (six) hours as needed for wheezing. (Patient not taking: Reported on 04/18/2015) 1 Inhaler 12   No facility-administered medications prior to visit.       Review of Systems  Constitutional: Positive for appetite change. Negative for fever and unexpected weight change.  HENT: Positive for congestion and dental problem. Negative for ear pain, nosebleeds, postnasal drip, rhinorrhea, sinus pressure, sneezing, sore throat and trouble swallowing.   Eyes: Negative for redness and itching.  Respiratory: Positive for shortness of breath. Negative for cough, chest tightness and wheezing.   Cardiovascular: Negative for palpitations and leg swelling.  Gastrointestinal: Negative for nausea and vomiting.  Genitourinary: Negative for dysuria.  Musculoskeletal: Negative for joint swelling.  Skin: Negative for rash.  Neurological: Negative for headaches.  Hematological: Does not bruise/bleed easily.  Psychiatric/Behavioral: Positive for dysphoric mood. The patient is nervous/anxious.        Objective:   Physical Exam Filed Vitals:   04/18/15 1019  BP: 126/76  Pulse: 96  Height: '5\' 3"'$  (1.6 m)  Weight: 147 lb 6.4 oz (66.86 kg)  SpO2: 93%   Gen: well appearing, no acute distress HENT: NCAT, OP clear, neck supple without masses Eyes: PERRL, EOMi Lymph: no cervical lymphadenopathy PULM: CTA B CV: RRR, no mgr, no JVD GI: BS+, soft, nontender, no hsm Derm: no rash or skin breakdown MSK: normal bulk and tone Neuro: A&Ox4, CN II-XII intact, strength 5/5 in all 4 extremities Psyche: normal mood and affect    [facility] (6/16) with mean RA 6, PA 42/24 mean 32, mean PCWP 16, CI 2.69, PVR 3.4 WU. Cardiology records from July 2016 personally reviewed were she was treated for pulmonary hypertension felt to be WHO group 2 and 3, no pulmonary vasodilators 2016 CT chest angiogram images personally reviewed showing no pulmonary embolism, mild atelectasis in the bases but  no pulmonary parenchymal abnormality with the exception of some very mild centrilobular emphysema. June 2016 pulmonary function testing ratio 52%, FEV1 0.87 L (47% predicted no change with albuterol), total lung capacity 4.70 L (95% predicted), DLCO 8.54 (37% predicted)     Assessment & Plan:  COPD (chronic obstructive pulmonary disease) COPD: GOLD Stage 3, Grade C Combined recommendations from the [place], [employer], Investment banker, corporate, [place] (Qaseem A et al, Ann Intern Med. 2011;155(3):179) recommends tobacco cessation, pulmonary rehab (for symptomatic patients with an FEV1 < 50% predicted), supplemental oxygen (for patients with SaO2 <88% or paO2 <55), and appropriate bronchodilator therapy.  In regards  to long acting bronchodilators, they recommend monotherapy (FEV1 60-80% with symptoms weak evidence, FEV1 with symptoms <60% strong evidence), or combination therapy (FEV1 <60% with symptoms, strong recommendation, moderate evidence).  One should also provide patients with annual immunizations and consider therapy for prevention of COPD exacerbations (ie. roflumilast or azithromycin) when appopriate.  -O2 therapy: Walk today to check for oxygen need -Immunizations: She will check with the health department to find out which pneumonia vaccine she received -Tobacco use: Advised at length to quit smoking -Exercise: Advised at length to start exercising -Bronchodilator therapy: Start Spiriva, continue as needed albuterol -Exacerbation prevention: Quit smoking, Spiriva      Current outpatient prescriptions:  .  [name];[name], Inhale 2 puffs into the lungs as needed. Shortness of breathe, Disp: , Rfl:  .  [name], Take 2.5 mg by nebulization as needed. wheezing, Disp: , Rfl:  .  aspirin EC 81 MG tablet, Take 1  tablet (81 mg total) by mouth daily., Disp: 90 tablet, Rfl: 3 .  buPROPion (WELLBUTRIN XL) 150 MG 24 hr tablet, Take 1 tablet (150 mg total) by mouth 2 (two) times daily., Disp: 60 tablet, Rfl: 6 .  cetirizine (ZYRTEC) 10 MG tablet, Take 10 mg by mouth daily., Disp: , Rfl:  .  FLUoxetine (PROZAC) 20 MG capsule, Take 60 mg by mouth daily., Disp: , Rfl:  .  guaiFENesin-codeine (ROBITUSSIN AC) 100-10 MG/5ML syrup, Take 10-100 mg by mouth as needed. For cough, Disp: , Rfl:  .  losartan-hydrochlorothiazide (HYZAAR) 50-12.5 MG per tablet, Take 1 tablet by mouth daily., Disp: , Rfl:  .  simvastatin (ZOCOR) 20 MG tablet, Take 1 tablet (20 mg total) by mouth at bedtime., Disp: 30 tablet, Rfl: 6 .  tiotropium (SPIRIVA) 18 MCG inhalation capsule, Place 1 capsule (18 mcg total) into inhaler and inhale daily., Disp: 30 capsule, Rfl: 6

## 2015-04-28 ENCOUNTER — Telehealth: Payer: Self-pay | Admitting: Pulmonary Disease

## 2015-04-28 DIAGNOSIS — Z1239 Encounter for other screening for malignant neoplasm of breast: Secondary | ICD-10-CM | POA: Diagnosis not present

## 2015-04-28 DIAGNOSIS — I10 Essential (primary) hypertension: Secondary | ICD-10-CM | POA: Diagnosis not present

## 2015-04-28 DIAGNOSIS — J449 Chronic obstructive pulmonary disease, unspecified: Secondary | ICD-10-CM | POA: Diagnosis not present

## 2015-04-28 DIAGNOSIS — F329 Major depressive disorder, single episode, unspecified: Secondary | ICD-10-CM | POA: Diagnosis not present

## 2015-04-28 DIAGNOSIS — Z1211 Encounter for screening for malignant neoplasm of colon: Secondary | ICD-10-CM | POA: Diagnosis not present

## 2015-04-28 NOTE — Telephone Encounter (Signed)
Called pt. She reports she is unsure if [facility] received her RX for spiriva or not bc it was not ready today.  I called the pharm and had to [facility] x1 Will await call back

## 2015-04-29 NOTE — Telephone Encounter (Signed)
[facility] x 1 for pharmacy to see if they received rx for Spiriva

## 2015-05-02 NOTE — Telephone Encounter (Signed)
Pharmacy received Rx. Nothing further needed.

## 2015-05-02 NOTE — Telephone Encounter (Signed)
Pharm calling back to let us know that they have received the rx.Katrina Ball

## 2015-05-18 ENCOUNTER — Other Ambulatory Visit (HOSPITAL_COMMUNITY): Payer: Medicare Other

## 2015-05-31 ENCOUNTER — Ambulatory Visit (INDEPENDENT_AMBULATORY_CARE_PROVIDER_SITE_OTHER): Payer: Medicare Other | Admitting: Pulmonary Disease

## 2015-05-31 ENCOUNTER — Ambulatory Visit: Payer: Medicare Other | Admitting: Pulmonary Disease

## 2015-05-31 ENCOUNTER — Encounter: Payer: Self-pay | Admitting: Pulmonary Disease

## 2015-05-31 VITALS — BP 132/68 | HR 85 | Ht 63.0 in | Wt 149.0 lb

## 2015-05-31 DIAGNOSIS — Z23 Encounter for immunization: Secondary | ICD-10-CM | POA: Diagnosis not present

## 2015-05-31 DIAGNOSIS — Z720 Tobacco use: Secondary | ICD-10-CM | POA: Diagnosis not present

## 2015-05-31 DIAGNOSIS — J438 Other emphysema: Secondary | ICD-10-CM

## 2015-05-31 MED ORDER — BUPROPION HCL ER (XL) 150 MG PO TB24: 150.0000 mg | ORAL_TABLET | Freq: Every day | ORAL | Status: DC

## 2015-05-31 NOTE — Patient Instructions (Signed)
Take Wellbutrin nightly to help you quit smoking Quit smoking Keep taking Spiriva as you are doing Exercise regularly Let us know what type of pneumonia vaccine you have had We will see you back in 6 months or sooner if needed

## 2015-05-31 NOTE — Progress Notes (Signed)
Subjective:    Patient ID: Katrina Ball, female    DOB: 1946/05/24, 69 y.o.   MRN: [id number]  Synopsis: Severe COPD, started on Spiriva in 2016 [place] (6/16) with mean RA 6, PA 42/24 mean 32, mean PCWP 16, CI 2.69, PVR 3.4 WU. Cardiology records from July 2016 personally reviewed were she was treated for pulmonary hypertension felt to be WHO group 2 and 3, no pulmonary vasodilators 2016 CT chest angiogram images personally reviewed showing no pulmonary embolism, mild atelectasis in the bases but no pulmonary parenchymal abnormality with the exception of some very mild centrilobular emphysema. June 2016 pulmonary function testing ratio 52%, FEV1 0.87 L (47% predicted no change with albuterol), total lung capacity 4.70 L (95% predicted), DLCO 8.54 (37% predicted)  HPI Chief Complaint  Patient presents with  . Follow-up    pt doing well on spiriva.  does note a sometimes prod cough with uncolored mucus.     [name] has been doing well since the last visit. She says that her shortness of breath has improved since starting Spiriva. She takes it daily. She notes that she continues to use albuterol at night. However, her symptoms have improved significantly. She is rarely using the nebulizer that she had to use frequently before. Her weight has been stable. There are shortness of breath has improved she still is not exercising regularly. She is still smoking 3 cigarettes daily. Sunday she smokes more than this. On the days and she smokes less than a she says that her breathing has improved significantly.   Past Medical History  Diagnosis Date  . Hypertension   . Depression   . Heart murmur   . Arthritis   . Fracture of tibial plateau, closed 12/23/2012  . Sleep apnea   . Exertional shortness of breath   . Anemia     "used to be" (12/23/2012)  . GERD (gastroesophageal reflux disease)   . Anxiety   . Arrhythmia   . Chest pain   . Numbness and tingling in hands   . COPD (chronic obstructive  pulmonary disease) ([place])       Review of Systems  Constitutional: Negative for fever, chills and fatigue.  HENT: Negative for postnasal drip, rhinorrhea and sinus pressure.   Respiratory: Positive for cough and shortness of breath. Negative for wheezing.   Cardiovascular: Negative for chest pain, palpitations and leg swelling.       Objective:   Physical Exam Filed Vitals:   05/31/15 1330  BP: 132/68  Pulse: 85  Height: '5\' 3"'$  (1.6 m)  Weight: 149 lb (67.586 kg)  SpO2: 96%   RA  Gen: well appearing HENT: OP clear, TM's clear, neck supple PULM: CTA B, normal percussion CV: RRR, no mgr, trace edema GI: BS+, soft, nontender Derm: no cyanosis or rash Psyche: normal mood and affect        Assessment & Plan:  COPD (chronic obstructive pulmonary disease) She has severe COPD and her symptoms have improved with Spiriva. However, she continues to smoke. I explained to her today that this will continue to cause deterioration of her lung function and worsening symptoms.  Plan: Quit smoking Continue Spiriva Flu shot today She needs to let us know what form of pneumonia vaccine she has had so that we can give her the other Follow-up 6 months or sooner if needed  Tobacco abuse Unfortunately she continues to smoke. As detailed above I explained to her today at length the need to quit smoking. She  had a CT scan this year which did not show evidence of likely lung malignancy, though she is at increased risk. Next year she will need to start lung cancer screening CT scans.  Plan: Lung cancer screening in 2017 Start Wellbutrin to assist with smoking cessation, she tells me she has no history of seizure disorder     Current outpatient prescriptions:  .  [name];[name], Inhale 2 puffs into the lungs as needed. Shortness of breathe, Disp: , Rfl:  .  [name], Take 2.5 mg by  nebulization as needed. wheezing, Disp: , Rfl:  .  aspirin EC 81 MG tablet, Take 1 tablet (81 mg total) by mouth daily., Disp: 90 tablet, Rfl: 3 .  cetirizine (ZYRTEC) 10 MG tablet, Take 10 mg by mouth daily., Disp: , Rfl:  .  FLUoxetine (PROZAC) 20 MG capsule, Take 60 mg by mouth daily., Disp: , Rfl:  .  guaiFENesin-codeine (ROBITUSSIN AC) 100-10 MG/5ML syrup, Take 10-100 mg by mouth as needed. For cough, Disp: , Rfl:  .  losartan-hydrochlorothiazide (HYZAAR) 50-12.5 MG per tablet, Take 1 tablet by mouth daily., Disp: , Rfl:  .  simvastatin (ZOCOR) 20 MG tablet, Take 1 tablet (20 mg total) by mouth at bedtime., Disp: 30 tablet, Rfl: 6 .  tiotropium (SPIRIVA) 18 MCG inhalation capsule, Place 1 capsule (18 mcg total) into inhaler and inhale daily., Disp: 30 capsule, Rfl: 6 .  buPROPion (WELLBUTRIN XL) 150 MG 24 hr tablet, Take 1 tablet (150 mg total) by mouth daily., Disp: 30 tablet, Rfl: 3

## 2015-05-31 NOTE — Assessment & Plan Note (Signed)
She has severe COPD and her symptoms have improved with Spiriva. However, she continues to smoke. I explained to her today that this will continue to cause deterioration of her lung function and worsening symptoms.  Plan: Quit smoking Continue Spiriva Flu shot today She needs to let us know what form of pneumonia vaccine she has had so that we can give her the other Follow-up 6 months or sooner if needed

## 2015-05-31 NOTE — Assessment & Plan Note (Signed)
Unfortunately she continues to smoke. As detailed above I explained to her today at length the need to quit smoking. She had a CT scan this year which did not show evidence of likely lung malignancy, though she is at increased risk. Next year she will need to start lung cancer screening CT scans.  Plan: Lung cancer screening in 2017 Start Wellbutrin to assist with smoking cessation, she tells me she has no history of seizure disorder

## 2015-06-16 DIAGNOSIS — N182 Chronic kidney disease, stage 2 (mild): Secondary | ICD-10-CM | POA: Diagnosis not present

## 2015-06-16 DIAGNOSIS — J449 Chronic obstructive pulmonary disease, unspecified: Secondary | ICD-10-CM | POA: Diagnosis not present

## 2015-06-16 DIAGNOSIS — I10 Essential (primary) hypertension: Secondary | ICD-10-CM | POA: Diagnosis not present

## 2015-06-16 DIAGNOSIS — F17200 Nicotine dependence, unspecified, uncomplicated: Secondary | ICD-10-CM | POA: Diagnosis not present

## 2015-12-07 ENCOUNTER — Ambulatory Visit: Payer: Medicare Other | Admitting: Pulmonary Disease

## 2015-12-15 ENCOUNTER — Encounter: Payer: Self-pay | Admitting: Pulmonary Disease

## 2015-12-15 ENCOUNTER — Ambulatory Visit (INDEPENDENT_AMBULATORY_CARE_PROVIDER_SITE_OTHER): Payer: Medicare Other | Admitting: Pulmonary Disease

## 2015-12-15 VITALS — BP 128/66 | HR 94 | Ht 63.0 in | Wt 131.0 lb

## 2015-12-15 DIAGNOSIS — Z23 Encounter for immunization: Secondary | ICD-10-CM | POA: Diagnosis not present

## 2015-12-15 DIAGNOSIS — F17200 Nicotine dependence, unspecified, uncomplicated: Secondary | ICD-10-CM | POA: Diagnosis not present

## 2015-12-15 DIAGNOSIS — R5382 Chronic fatigue, unspecified: Secondary | ICD-10-CM

## 2015-12-15 DIAGNOSIS — J438 Other emphysema: Secondary | ICD-10-CM

## 2015-12-15 DIAGNOSIS — R5383 Other fatigue: Secondary | ICD-10-CM | POA: Insufficient documentation

## 2015-12-15 DIAGNOSIS — Z720 Tobacco use: Secondary | ICD-10-CM | POA: Diagnosis not present

## 2015-12-15 NOTE — Assessment & Plan Note (Signed)
She was counseled at length to quit smoking. We will provide her with smoking cessation literature today

## 2015-12-15 NOTE — Progress Notes (Signed)
Subjective:    Patient ID: Katrina Ball, female    DOB: [birth year], 70 y.o.   MRN: [id number]  Synopsis: Severe COPD, started on Spiriva in 2016 [place] (6/16) with mean RA 6, PA 42/24 mean 32, mean PCWP 16, CI 2.69, PVR 3.4 WU. Cardiology records from July 2016 personally reviewed were she was treated for pulmonary hypertension felt to be WHO group 2 and 3, no pulmonary vasodilators 2016 CT chest angiogram images personally reviewed showing no pulmonary embolism, mild atelectasis in the bases but no pulmonary parenchymal abnormality with the exception of some very mild centrilobular emphysema. June 2016 pulmonary function testing ratio 52%, FEV1 0.87 L (47% predicted no change with albuterol), total lung capacity 4.70 L (95% predicted), DLCO 8.54 (37% predicted)  HPI Chief Complaint  Patient presents with  . Follow-up    pt has good and bad days- states she does worse when outside. Bad days include increased sob with any exertion.     Katrina Ball has been doing OK.  She says that some days are worse than others in that she has less energy some days more than others.  She says that she can sometimes have a burst of energy, then other times none.  She says that some days with the pollen out she has puffiness in her eye.   She has not really had too much breathing unless she tries to walk to fast.  She rarely has problems breathing that limits her activity.  She is not exercising, but she stays active with shopping, running vacuum cleaner, etc.  No leg swelling.  She feels like a lot of dust in the air will make her worse.    She does note that she has trouble sleeping often.  She does feel well rested most morning.  She does not nap nor does she feel daytime sleepiness.  She will sometimes have a morning headache.     Past Medical History  Diagnosis Date  . Hypertension   . Depression   . Heart murmur   . Arthritis   . Fracture of tibial plateau, closed 12/23/2012  . Sleep apnea   .  Exertional shortness of breath   . Anemia     "used to be" (12/23/2012)  . GERD (gastroesophageal reflux disease)   . Anxiety   . Arrhythmia   . Chest pain   . Numbness and tingling in hands   . COPD (chronic obstructive pulmonary disease) ([place])       Review of Systems  Constitutional: Negative for fever, chills and fatigue.  HENT: Negative for postnasal drip, rhinorrhea and sinus pressure.   Respiratory: Positive for shortness of breath. Negative for cough and wheezing.   Cardiovascular: Negative for chest pain, palpitations and leg swelling.       Objective:   Physical Exam Filed Vitals:   12/15/15 1146  BP: 128/66  Pulse: 94  Height: '5\' 3"'$  (1.6 m)  Weight: 131 lb (59.421 kg)  SpO2: 93%   RA  Gen: well appearing HENT: OP clear, TM's clear, neck supple PULM: CTA B, normal percussion CV: RRR, no mgr, trace edema GI: BS+, soft, nontender Derm: no cyanosis or rash Psyche: normal mood and affect  Records from Dr. Aundra Dubin reviewed where she was treated for her pulmonary hypertension  CBC    Component Value Date/Time   WBC 6.2 02/17/2015 0640   RBC 4.49 02/17/2015 0640   HGB 13.0 02/17/2015 0640   HCT 40.2 02/17/2015 0640   PLT 239  02/17/2015 0640   MCV 89.5 02/17/2015 0640   MCH 29.0 02/17/2015 0640   MCHC 32.3 02/17/2015 0640   RDW 15.9* 02/17/2015 0640   LYMPHSABS 1.7 07/24/2010 2151   MONOABS 0.3 07/24/2010 2151   EOSABS 0.1 07/24/2010 2151   BASOSABS 0.0 07/24/2010 2151         Assessment & Plan:  COPD (chronic obstructive pulmonary disease) This has been a stable interval for [facility] without exacerbations.  She continues to do well with Spiriva and I see no indication for changing her bronchodilators.  Plan: Prevnar vaccine today Continue Spiriva Counseled to quit smoking Follow-up 6 months   Tobacco abuse She was counseled at length to quit smoking. We will provide her with smoking cessation literature today  Fatigue She has fatigue which  seems to correlate with poor sleep at night. Sometimes she has morning headaches as well. With her underlying COPD and pulmonary hypertension there is a fairly high pretest probability of obstructive sleep apnea.  Plan: Polysomnogram     Current outpatient prescriptions:  .  [name];[name], Inhale 2 puffs into the lungs as needed. Shortness of breathe, Disp: , Rfl:  .  [name], Take 2.5 mg by nebulization as needed. wheezing, Disp: , Rfl:  .  aspirin EC 81 MG tablet, Take 1 tablet (81 mg total) by mouth daily., Disp: 90 tablet, Rfl: 3 .  buPROPion (WELLBUTRIN XL) 150 MG 24 hr tablet, Take 1 tablet (150 mg total) by mouth daily., Disp: 30 tablet, Rfl: 3 .  cetirizine (ZYRTEC) 10 MG tablet, Take 10 mg by mouth daily., Disp: , Rfl:  .  FLUoxetine (PROZAC) 20 MG capsule, Take 60 mg by mouth daily., Disp: , Rfl:  .  guaiFENesin-codeine (ROBITUSSIN AC) 100-10 MG/5ML syrup, Take 10-100 mg by mouth as needed. For cough, Disp: , Rfl:  .  losartan-hydrochlorothiazide (HYZAAR) 50-12.5 MG per tablet, Take 1 tablet by mouth daily., Disp: , Rfl:  .  simvastatin (ZOCOR) 20 MG tablet, Take 1 tablet (20 mg total) by mouth at bedtime., Disp: 30 tablet, Rfl: 6 .  tiotropium (SPIRIVA) 18 MCG inhalation capsule, Place 1 capsule (18 mcg total) into inhaler and inhale daily., Disp: 30 capsule, Rfl: 6

## 2015-12-15 NOTE — Assessment & Plan Note (Signed)
This has been a stable interval for [facility] without exacerbations.  She continues to do well with Spiriva and I see no indication for changing her bronchodilators.  Plan: Prevnar vaccine today Continue Spiriva Counseled to quit smoking Follow-up 6 months

## 2015-12-15 NOTE — Patient Instructions (Signed)
We will call you with the results of the sleep study Stop smoking Keep taking spiriva We will see you back in 6 months or sooner if needed

## 2015-12-15 NOTE — Addendum Note (Signed)
Addended by: Len Blalock on: 12/15/2015 02:39 PM   Modules accepted: Orders

## 2015-12-15 NOTE — Assessment & Plan Note (Signed)
She has fatigue which seems to correlate with poor sleep at night. Sometimes she has morning headaches as well. With her underlying COPD and pulmonary hypertension there is a fairly high pretest probability of obstructive sleep apnea.  Plan: Polysomnogram

## 2016-02-09 ENCOUNTER — Encounter (HOSPITAL_BASED_OUTPATIENT_CLINIC_OR_DEPARTMENT_OTHER): Payer: Medicare Other

## 2016-02-25 IMAGING — CT CT ANGIO CHEST
2 of 6 series · 18 of 36 positions shown · IV contrast (Omnipaque 300)
Comparison: Chest radiograph October 05, 2013

CLINICAL DATA: Pulmonary arterial hypertension.

EXAM:
CT ANGIOGRAPHY CHEST WITH CONTRAST
TECHNIQUE: Multidetector CT imaging of the chest was performed using the
standard protocol during bolus administration of intravenous
contrast. Multiplanar CT image reconstructions and MIPs were
obtained to evaluate the vascular anatomy.
CONTRAST:  80mL OMNIPAQUE IOHEXOL 350 MG/ML SOLN

[Series 5: thins (id) / (id) · axial · 0.63mm/px · z∈[-285,-28]mm · 17 of 287 slices shown]
[im 15/287  lung]
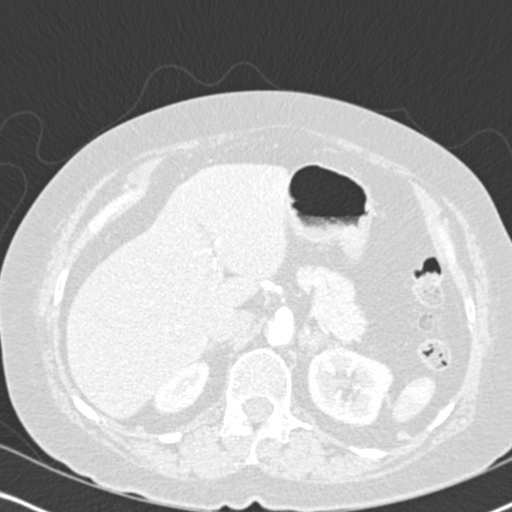
[im 29/287  mediastinal]
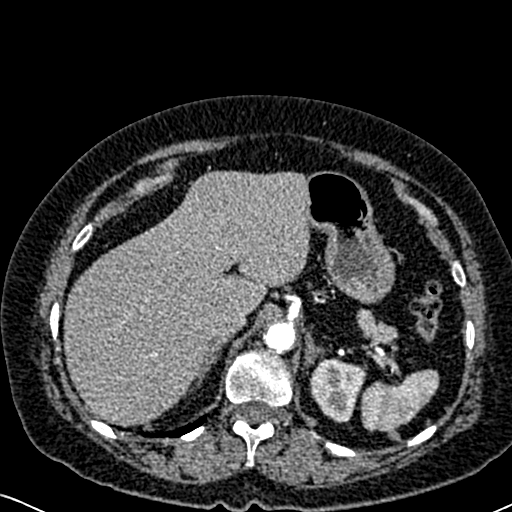
[im 43/287  lung]
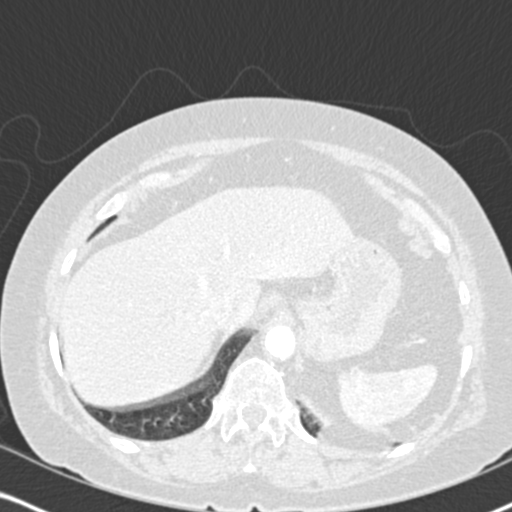
[im 58/287  mediastinal]
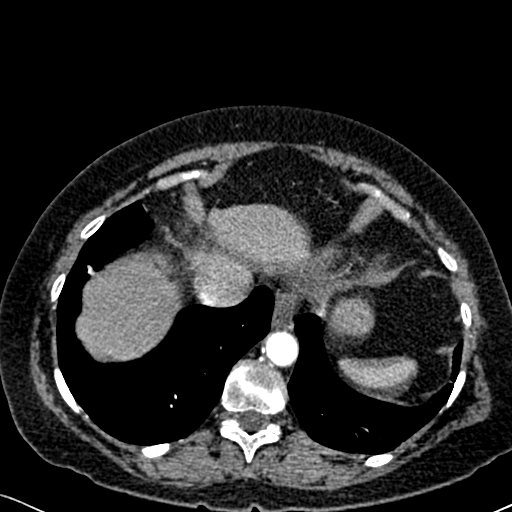
[im 86/287  lung]
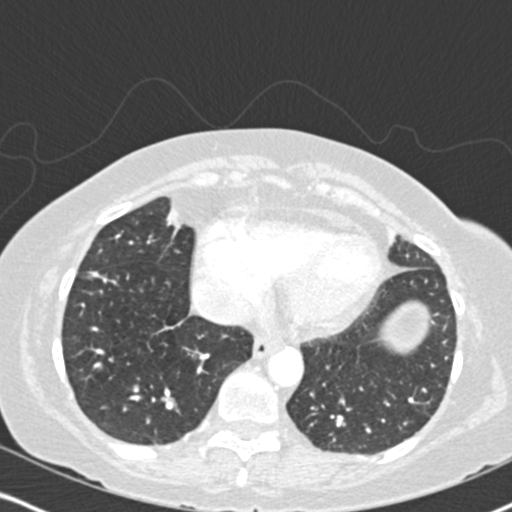
[im 101/287  mediastinal]
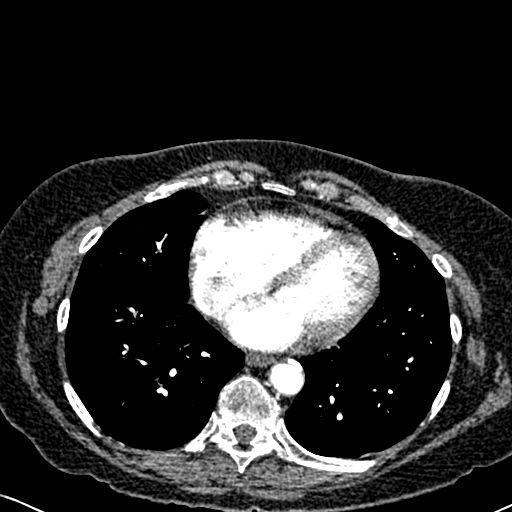
[im 115/287  lung]
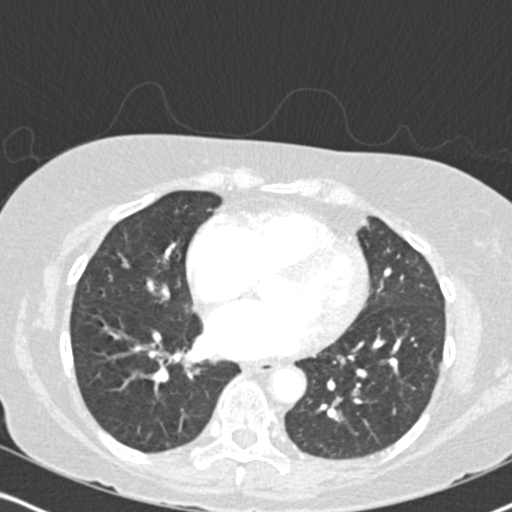
[im 129/287  mediastinal]
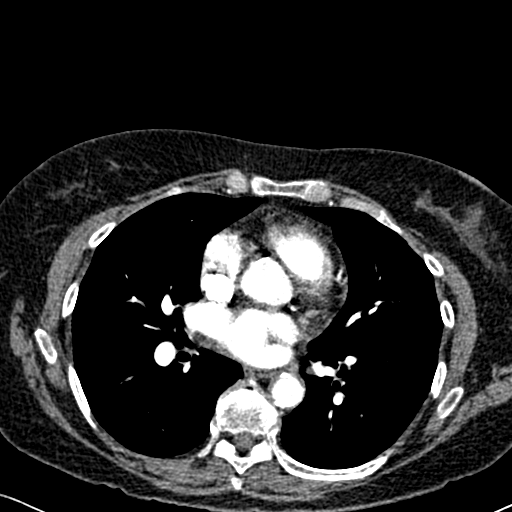
[im 144/287  lung]
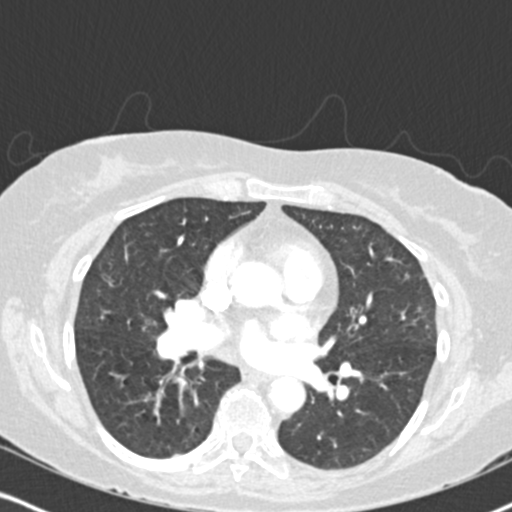
[im 158/287  mediastinal]
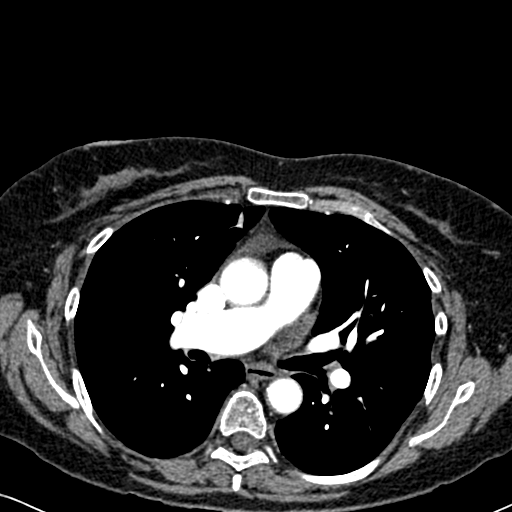
[im 172/287  lung]
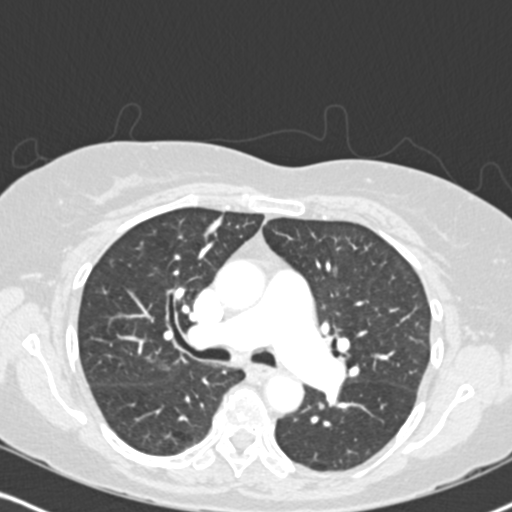
[im 186/287  mediastinal]
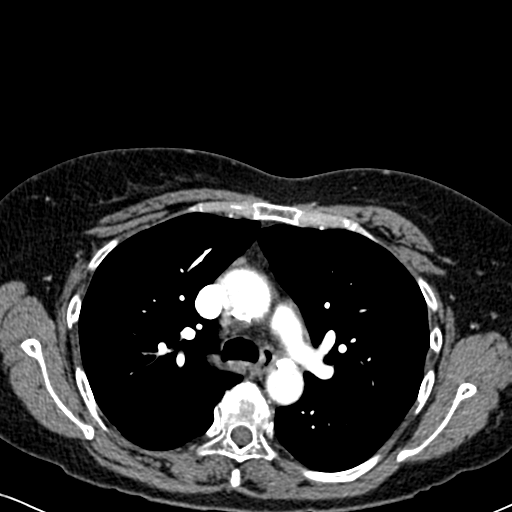
[im 201/287  lung]
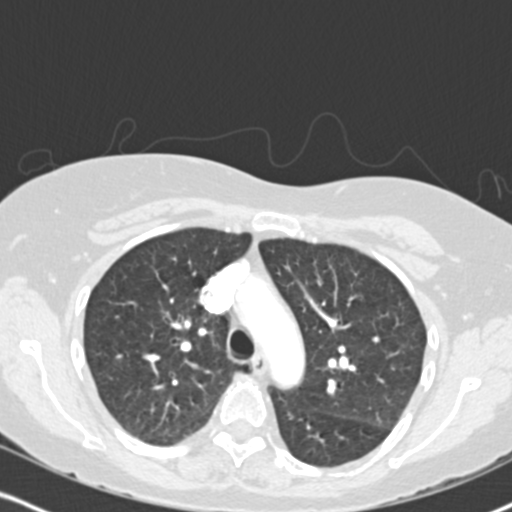
[im 229/287  mediastinal]
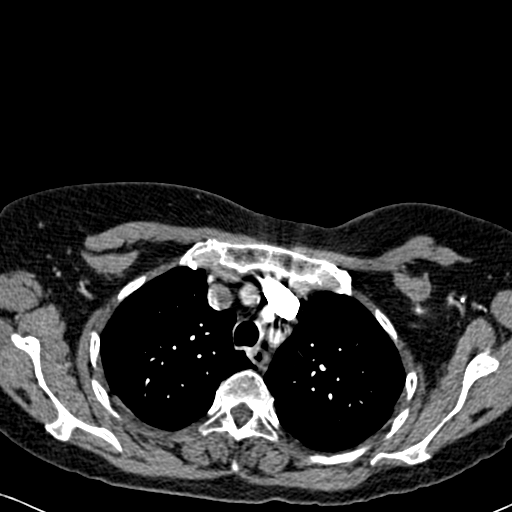
[im 244/287  lung]
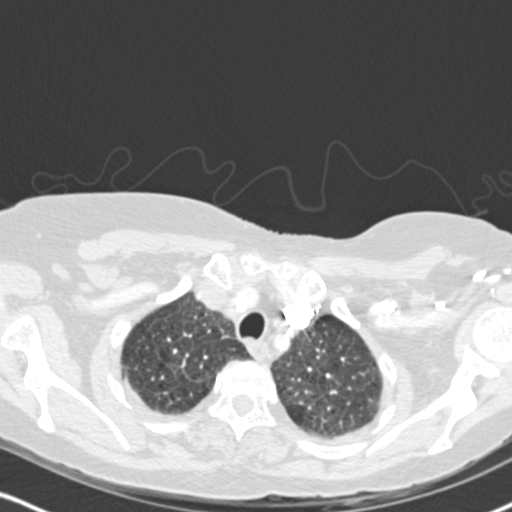
[im 258/287  mediastinal]
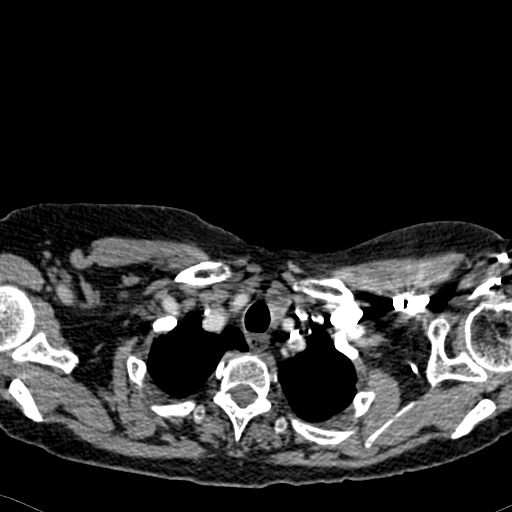
[im 272/287  lung]
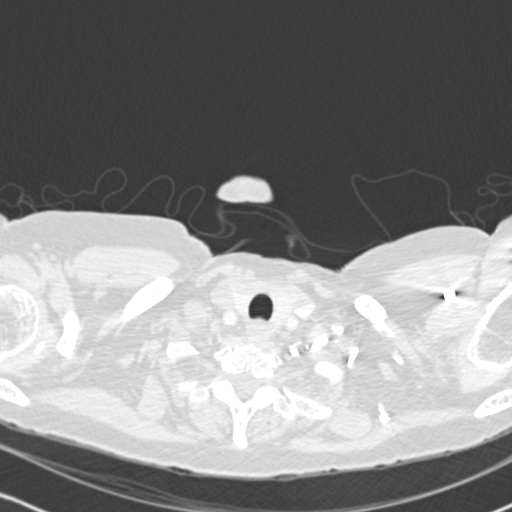

[Series 602: cor mpr · coronal · 0.63mm/px · 1 of 103 slices shown]
[im 52/103  mediastinal]
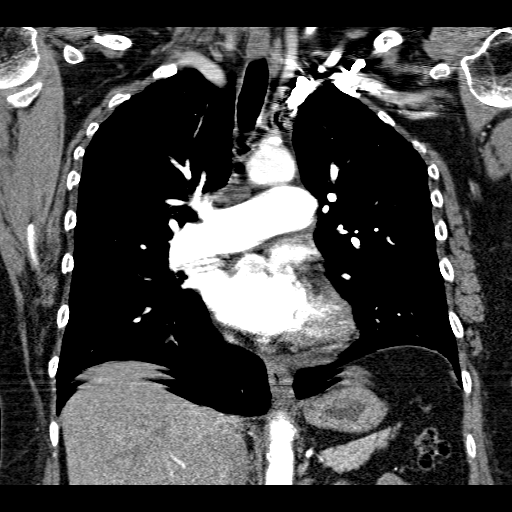

[18 of 36 positions shown; findings below may reference images not displayed]

FINDINGS: There is central pulmonary arterial prominence with rapid peripheral
tapering consistent with pulmonary arterial hypertension. There is
no demonstrable pulmonary embolus. There is no evidence of
irregularity in the contour of the pulmonary arteries or webbing
within these vessels. There is atherosclerotic change in the aorta
but no aneurysm or dissection.

There is mild scarring in the right middle lobe inferiorly. There is
also mild scarring in the lateral left base. There is no lung edema
or consolidation. There are areas of mild lobular septal thickening
in the upper lobes consistent with mild chronic inflammatory type
change.

There is no appreciable thoracic adenopathy. Visualized thyroid
appears mildly inhomogeneous without dominant mass. Pericardium is
minimally thickened anteriorly.

In the visualized upper abdomen, there is mild left adrenal
hypertrophy. There is a small foramen of Bachdalek hernia on the
left containing only fat.

There is degenerative change in the thoracic spine. There are no
blastic or lytic bone lesions.

Review of the MIP images confirms the above findings.
IMPRESSION: No evidence suggesting acute or chronic pulmonary embolus. There is
pulmonary arterial hypertension.

There is atherosclerotic change in aorta without aneurysm or
dissection apparent.

Areas of mild lung scarring.  No lung edema or consolidation.

No appreciable adenopathy.

Small foramen of Bochdalek hernia on the left containing only fat.
There is left adrenal hypertrophy.

Slight anterior pericardial thickening. Significance of this finding
is uncertain.

## 2016-04-27 DIAGNOSIS — H2513 Age-related nuclear cataract, bilateral: Secondary | ICD-10-CM | POA: Diagnosis not present

## 2016-06-06 DIAGNOSIS — N182 Chronic kidney disease, stage 2 (mild): Secondary | ICD-10-CM | POA: Diagnosis not present

## 2016-06-06 DIAGNOSIS — I10 Essential (primary) hypertension: Secondary | ICD-10-CM | POA: Diagnosis not present

## 2016-06-06 DIAGNOSIS — R7303 Prediabetes: Secondary | ICD-10-CM | POA: Diagnosis not present

## 2016-06-06 DIAGNOSIS — J449 Chronic obstructive pulmonary disease, unspecified: Secondary | ICD-10-CM | POA: Diagnosis not present

## 2016-06-15 ENCOUNTER — Ambulatory Visit: Payer: Medicare Other | Admitting: Pulmonary Disease

## 2016-06-19 ENCOUNTER — Ambulatory Visit (INDEPENDENT_AMBULATORY_CARE_PROVIDER_SITE_OTHER): Payer: Medicare Other | Admitting: Pulmonary Disease

## 2016-06-19 ENCOUNTER — Telehealth: Payer: Self-pay | Admitting: Pulmonary Disease

## 2016-06-19 ENCOUNTER — Encounter: Payer: Self-pay | Admitting: Pulmonary Disease

## 2016-06-19 VITALS — BP 134/68 | HR 92 | Ht 63.0 in | Wt 150.0 lb

## 2016-06-19 DIAGNOSIS — J438 Other emphysema: Secondary | ICD-10-CM | POA: Diagnosis not present

## 2016-06-19 DIAGNOSIS — Z720 Tobacco use: Secondary | ICD-10-CM | POA: Diagnosis not present

## 2016-06-19 NOTE — Assessment & Plan Note (Signed)
Counseled again to quit smoking. Provide with smoking cessation materials. Referred to the lung cancer screening program.

## 2016-06-19 NOTE — Progress Notes (Signed)
Subjective:    Patient ID: Katrina Ball, female    DOB: December 18, 1945, 70 y.o.   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 Cardiology records from July 2016 personally reviewed were she was treated for pulmonary hypertension felt to be WHO group 2 and 3, no pulmonary vasodilators 2016 CT chest angiogram images personally reviewed showing no pulmonary embolism, mild atelectasis in the bases but no pulmonary parenchymal abnormality with the exception of some very mild centrilobular emphysema. June 2016 pulmonary function testing ratio 52%, FEV1 0.87 L (47% predicted no change with albuterol), total lung capacity 4.70 L (95% predicted), DLCO 8.54 (37% predicted)  HPI Chief Complaint  Patient presents with  . Follow-up    pt has good and bad days- c/o fatigue, sinus pressure, sob with exertion.      Adriauna still has dyspnea sometimes.  She is not exercising often.  Overall her activity is about the same as it was 6 months ago.  There really hasn't been a major worsening in her dyspnea since the last visit.   She was given a new inhaler by her PCP recently, but she hasn't taken it.    Cough> minimal, no mucus production.  She does produce some mucus first thing in the morning.  She feels like there is mucus draining from her nose in the mornings.     She has been given an albuterol inhaler by her PCP when she was seen there a week ago due to increasing dyspnea. She was told to take mucinex and an antibiotic.  She doesn't recall taking prednisone.  She was told this may be related to an acute sinus infection.  She didn't have any wheezing or dyspnea at the time of her assessment at her PCP last week for this.    She has been taking zyrtec for stuffiness from her nose.  She takes a nose spray.    Past Medical History:  Diagnosis Date  . Anemia    "used to be" (12/23/2012)  . Anxiety   .  Arrhythmia   . Arthritis   . Chest pain   . COPD (chronic obstructive pulmonary disease) ([place])   . Depression   . Exertional shortness of breath   . Fracture of tibial plateau, closed 12/23/2012  . GERD (gastroesophageal reflux disease)   . Heart murmur   . Hypertension   . Numbness and tingling in hands   . Sleep apnea       Review of Systems  Constitutional: Negative for chills, fatigue and fever.  HENT: Negative for postnasal drip, rhinorrhea and sinus pressure.   Respiratory: Positive for shortness of breath. Negative for cough and wheezing.   Cardiovascular: Negative for chest pain, palpitations and leg swelling.       Objective:   Physical Exam Vitals:   06/19/16 1530  BP: 134/68  Pulse: 92  SpO2: 92%  Weight: 150 lb (68 kg)  Height: '5\' 3"'$  (1.6 m)   RA  Gen: well appearing HENT: OP clear, neck supple PULM: CTA B, normal percussion CV: RRR, no mgr, trace edema GI: BS+, soft, nontender Derm: no cyanosis or rash Psyche: normal mood and affect  Records from Dr. Aundra Dubin reviewed where she was treated for her pulmonary hypertension  CBC    Component Value Date/Time   WBC 6.2 02/17/2015 0640   RBC 4.49 02/17/2015 0640  HGB 13.0 02/17/2015 0640   HCT 40.2 02/17/2015 0640   PLT 239 02/17/2015 0640   MCV 89.5 02/17/2015 0640   MCH 29.0 02/17/2015 0640   MCHC 32.3 02/17/2015 0640   RDW 15.9 (H) 02/17/2015 0640   LYMPHSABS 1.7 07/24/2010 2151   MONOABS 0.3 07/24/2010 2151   EOSABS 0.1 07/24/2010 2151   BASOSABS 0.0 07/24/2010 2151         Assessment & Plan:  COPD (chronic obstructive pulmonary disease) She had a recent acute sinus infection but it doesn't sound like she's had a recent exacerbation of COPD. Her lungs are unchanged today on exam compared to previous. So at this point I don't think she needs to have any additional inhaled bronchodilator or steroid therapy. I'm not certain what was recently prescribed some vascular to go home and call us with  the name of this new inhaler.  Plan: Continue Spiriva daily and as needed albuterol for now Renewed prescription for supplies for nebulizer machine Call us with the name of the new inhaler from primary care physician and will give her an assessment as to whether or not she needs to take it Flu shot is up-to-date Follow-up 6 months  Tobacco abuse Counseled again to quit smoking. Provide with smoking cessation materials. Referred to the lung cancer screening program.    Current Outpatient Prescriptions:  .  [name];[name], Inhale 2 puffs into the lungs as needed. Shortness of breathe, Disp: , Rfl:  .  [name], Take 2.5 mg by nebulization as needed. wheezing, Disp: , Rfl:  .  buPROPion (WELLBUTRIN XL) 150 MG 24 hr tablet, Take 1 tablet (150 mg total) by mouth daily., Disp: 30 tablet, Rfl: 3 .  cetirizine (ZYRTEC) 10 MG tablet, Take 10 mg by mouth daily., Disp: , Rfl:  .  FLUoxetine (PROZAC) 20 MG capsule, Take 60 mg by mouth daily., Disp: , Rfl:  .  guaiFENesin-codeine (ROBITUSSIN AC) 100-10 MG/5ML syrup, Take 10-100 mg by mouth as needed. For cough, Disp: , Rfl:  .  losartan-hydrochlorothiazide (HYZAAR) 50-12.5 MG per tablet, Take 1 tablet by mouth daily., Disp: , Rfl:  .  aspirin EC 81 MG tablet, Take 1 tablet (81 mg total) by mouth daily. (Patient not taking: Reported on 06/19/2016), Disp: 90 tablet, Rfl: 3 .  simvastatin (ZOCOR) 20 MG tablet, Take 1 tablet (20 mg total) by mouth at bedtime. (Patient not taking: Reported on 06/19/2016), Disp: 30 tablet, Rfl: 6 .  tiotropium (SPIRIVA) 18 MCG inhalation capsule, Place 1 capsule (18 mcg total) into inhaler and inhale daily. (Patient not taking: Reported on 06/19/2016), Disp: 30 capsule, Rfl: 6

## 2016-06-19 NOTE — Patient Instructions (Signed)
Call us with the name of the new prescription given by her primary care physician Keep taking the Spiriva and albuterol We will see you back in 6 months or sooner if needed

## 2016-06-19 NOTE — Telephone Encounter (Signed)
FYI: Spoke with pt. She states the the name of the med given by other physician was Qvar 40.

## 2016-06-19 NOTE — Assessment & Plan Note (Signed)
She had a recent acute sinus infection but it doesn't sound like she's had a recent exacerbation of COPD. Her lungs are unchanged today on exam compared to previous. So at this point I don't think she needs to have any additional inhaled bronchodilator or steroid therapy. I'm not certain what was recently prescribed some vascular to go home and call us with the name of this new inhaler.  Plan: Continue Spiriva daily and as needed albuterol for now Renewed prescription for supplies for nebulizer machine Call us with the name of the new inhaler from primary care physician and will give her an assessment as to whether or not she needs to take it Flu shot is up-to-date Follow-up 6 months

## 2016-06-20 NOTE — Telephone Encounter (Signed)
Pt aware of below message. Pt voiced understanding and had no further questions. Nothing further needed.

## 2016-06-20 NOTE — Telephone Encounter (Signed)
I don't think she needs to take this. Please let her know

## 2016-11-13 DIAGNOSIS — R252 Cramp and spasm: Secondary | ICD-10-CM | POA: Diagnosis not present

## 2016-11-13 DIAGNOSIS — J329 Chronic sinusitis, unspecified: Secondary | ICD-10-CM | POA: Diagnosis not present

## 2016-11-13 DIAGNOSIS — I10 Essential (primary) hypertension: Secondary | ICD-10-CM | POA: Diagnosis not present

## 2016-11-13 DIAGNOSIS — Z13220 Encounter for screening for lipoid disorders: Secondary | ICD-10-CM | POA: Diagnosis not present

## 2016-11-13 DIAGNOSIS — R7301 Impaired fasting glucose: Secondary | ICD-10-CM | POA: Diagnosis not present

## 2016-12-03 ENCOUNTER — Ambulatory Visit (INDEPENDENT_AMBULATORY_CARE_PROVIDER_SITE_OTHER): Payer: Medicare Other | Admitting: Pulmonary Disease

## 2016-12-03 ENCOUNTER — Encounter: Payer: Self-pay | Admitting: Pulmonary Disease

## 2016-12-03 DIAGNOSIS — Z720 Tobacco use: Secondary | ICD-10-CM

## 2016-12-03 DIAGNOSIS — J438 Other emphysema: Secondary | ICD-10-CM

## 2016-12-03 DIAGNOSIS — J3089 Other allergic rhinitis: Secondary | ICD-10-CM | POA: Diagnosis not present

## 2016-12-03 NOTE — Progress Notes (Signed)
Subjective:    Patient ID: Katrina Ball, female    DOB: [birth year], 71 y.o.   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 Follow-up    pt c/o sinus congestion, pnd, sob with exertion.     Katrina Ball says that she has been having trouble with her sinuses.  She feels full of mucus all the time which makes it hard to breathe.  Once her sinuses are empty she breathes better.  This contributes to cough.  However she denies breathing difficulty or chest congestion.  No episodes of chest infection.  She continues to take Spiriva daily.    She is still smoking "3-5 cigarettes a day and admits that it is often more than that".   She hasn't really tried to quit smoking but she does feel that she is trying to cut back.  She is considering cutting back on smoking.    Past Medical History:  Diagnosis Date  . Anemia    "used to be" (12/23/2012)  . Anxiety   . Arrhythmia   . Arthritis   . Chest pain   . COPD (chronic obstructive pulmonary disease) ([place])   . Depression   . Exertional shortness of breath   . Fracture of tibial plateau, closed 12/23/2012  . GERD (gastroesophageal reflux disease)   . Heart murmur   . Hypertension   . Numbness and tingling in hands   . Sleep apnea       Review of Systems  Constitutional: Negative for chills, fatigue and fever.  HENT: Negative for postnasal drip, rhinorrhea and sinus pressure.   Respiratory: Positive for shortness of breath. Negative for cough and wheezing.   Cardiovascular: Negative for chest pain, palpitations and leg swelling.       Objective:   Physical Exam Vitals:   12/03/16 1024  BP: 130/74  Pulse: 84  SpO2: 95%  Weight: 66.7 kg (147 lb)  Height: '5\' 3"'$  (1.6 m)   RA  Gen: well appearing HENT: OP clear, TM's clear, neck supple PULM: CTA B, normal percussion CV: RRR, no mgr, trace edema GI: BS+, soft, nontender Derm: no cyanosis or rash Psyche: normal  mood and affect   RHC: RHC (6/16) with mean RA 6, PA 42/24 mean 32, mean PCWP 16, CI 2.69, PVR 3.4 WU.  Imaging: 2016 CT chest angiogram images personally reviewed showing no pulmonary embolism, mild atelectasis in the bases but no pulmonary parenchymal abnormality with the exception of some very mild centrilobular emphysema.  PFT: June 2016 pulmonary function testing ratio 52%, FEV1 0.87 L (47% predicted no change with albuterol), total lung capacity 4.70 L (95% predicted), DLCO 8.54 (37% predicted)   CBC    Component Value Date/Time   WBC 6.2 02/17/2015 0640   RBC 4.49 02/17/2015 0640   HGB 13.0 02/17/2015 0640   HCT 40.2 02/17/2015 0640   PLT 239 02/17/2015 0640   MCV 89.5 02/17/2015 0640   MCH 29.0 02/17/2015 0640   MCHC 32.3 02/17/2015 0640   RDW 15.9 (H) 02/17/2015 0640   LYMPHSABS 1.7 07/24/2010 2151   MONOABS 0.3 07/24/2010 2151   EOSABS 0.1 07/24/2010 2151   BASOSABS 0.0 07/24/2010 2151        Assessment & Plan:  Tobacco abuse Counseled for at least 5 minutes today on the significance of smoking cessation and the importance of it. She says that she is willing to try nicotine patches. I  counseled her to stop smoking altogether the day she starts using the nicotine patches. She was provided with smoking cessation information today.  COPD (chronic obstructive pulmonary disease) Stable interval without exacerbations.  Plan: Continue Spiriva daily Continue albuterol nebulized as needed. Get a flu shot in the fall.  Allergic rhinitis Use Neil Med rinses with distilled water at least twice per day using the instructions on the package. 1/2 hour after using the [facility] Med rinse, use Nasacort two puffs in each nostril once per day.  Remember that the Nasacort can take 1-2 weeks to work after regular use. Use generic zyrtec (cetirizine) every day.  If this doesn't help, then stop taking it and use chlorpheniramine-phenylephrine combination tablets.      Current  Outpatient Prescriptions:  .  [name];[name], Inhale 2 puffs into the lungs as needed. Shortness of breathe, Disp: , Rfl:  .  [name], Take 2.5 mg by nebulization as needed. wheezing, Disp: , Rfl:  .  aspirin EC 81 MG tablet, Take 1 tablet (81 mg total) by mouth daily., Disp: 90 tablet, Rfl: 3 .  buPROPion (WELLBUTRIN XL) 150 MG 24 hr tablet, Take 1 tablet (150 mg total) by mouth daily., Disp: 30 tablet, Rfl: 3 .  FLUoxetine (PROZAC) 20 MG capsule, Take 60 mg by mouth daily., Disp: , Rfl:  .  fluticasone (FLONASE) 50 MCG/ACT nasal spray, Place 2 sprays into both nostrils daily., Disp: , Rfl:  .  guaiFENesin-codeine (ROBITUSSIN AC) 100-10 MG/5ML syrup, Take 10-100 mg by mouth as needed. For cough, Disp: , Rfl:  .  loratadine (CLARITIN) 10 MG tablet, Take 10 mg by mouth daily., Disp: , Rfl:  .  losartan-hydrochlorothiazide (HYZAAR) 50-12.5 MG per tablet, Take 1 tablet by mouth daily., Disp: , Rfl:  .  simvastatin (ZOCOR) 20 MG tablet, Take 1 tablet (20 mg total) by mouth at bedtime., Disp: 30 tablet, Rfl: 6 .  tiotropium (SPIRIVA) 18 MCG inhalation capsule, Place 1 capsule (18 mcg total) into inhaler and inhale daily., Disp: 30 capsule, Rfl: 6

## 2016-12-03 NOTE — Assessment & Plan Note (Signed)
Use Neil Med rinses with distilled water at least twice per day using the instructions on the package. 1/2 hour after using the [facility] Med rinse, use Nasacort two puffs in each nostril once per day.  Remember that the Nasacort can take 1-2 weeks to work after regular use. Use generic zyrtec (cetirizine) every day.  If this doesn't help, then stop taking it and use chlorpheniramine-phenylephrine combination tablets.

## 2016-12-03 NOTE — Patient Instructions (Signed)
Stop smoking  For your sinuses I recommend the following: Use Neil Med rinses with distilled water at least twice per day using the instructions on the package. 1/2 hour after using the [facility] Med rinse, use Nasacort two puffs in each nostril once per day.  Remember that the Nasacort can take 1-2 weeks to work after regular use. Use generic zyrtec (cetirizine) every day.  If this doesn't help, then stop taking it and use chlorpheniramine-phenylephrine combination tablets.  Take spiriva once daily no matter how you feel Use albuterol as needed for shortness of breath  We will se you back in 1 year or sooner if needed

## 2016-12-03 NOTE — Assessment & Plan Note (Signed)
Counseled for at least 5 minutes today on the significance of smoking cessation and the importance of it. She says that she is willing to try nicotine patches. I counseled her to stop smoking altogether the day she starts using the nicotine patches. She was provided with smoking cessation information today.

## 2016-12-03 NOTE — Assessment & Plan Note (Signed)
Stable interval without exacerbations.  Plan: Continue Spiriva daily Continue albuterol nebulized as needed. Get a flu shot in the fall.

## 2017-06-12 DIAGNOSIS — Z1211 Encounter for screening for malignant neoplasm of colon: Secondary | ICD-10-CM | POA: Diagnosis not present

## 2017-06-12 DIAGNOSIS — Z1159 Encounter for screening for other viral diseases: Secondary | ICD-10-CM | POA: Diagnosis not present

## 2017-06-12 DIAGNOSIS — R0981 Nasal congestion: Secondary | ICD-10-CM | POA: Diagnosis not present

## 2017-06-12 DIAGNOSIS — Z716 Tobacco abuse counseling: Secondary | ICD-10-CM | POA: Diagnosis not present

## 2017-06-12 DIAGNOSIS — E782 Mixed hyperlipidemia: Secondary | ICD-10-CM | POA: Diagnosis not present

## 2017-06-12 DIAGNOSIS — Z862 Personal history of diseases of the blood and blood-forming organs and certain disorders involving the immune mechanism: Secondary | ICD-10-CM | POA: Diagnosis not present

## 2017-06-12 DIAGNOSIS — Z1231 Encounter for screening mammogram for malignant neoplasm of breast: Secondary | ICD-10-CM | POA: Diagnosis not present

## 2017-06-12 DIAGNOSIS — I10 Essential (primary) hypertension: Secondary | ICD-10-CM | POA: Diagnosis not present

## 2017-06-12 DIAGNOSIS — R7309 Other abnormal glucose: Secondary | ICD-10-CM | POA: Diagnosis not present

## 2017-06-12 DIAGNOSIS — Z0000 Encounter for general adult medical examination without abnormal findings: Secondary | ICD-10-CM | POA: Diagnosis not present

## 2017-06-12 DIAGNOSIS — R5383 Other fatigue: Secondary | ICD-10-CM | POA: Diagnosis not present

## 2017-06-12 DIAGNOSIS — E2839 Other primary ovarian failure: Secondary | ICD-10-CM | POA: Diagnosis not present

## 2017-06-18 DIAGNOSIS — E2839 Other primary ovarian failure: Secondary | ICD-10-CM | POA: Diagnosis not present

## 2017-06-18 DIAGNOSIS — Z1231 Encounter for screening mammogram for malignant neoplasm of breast: Secondary | ICD-10-CM | POA: Diagnosis not present

## 2017-06-18 DIAGNOSIS — M8589 Other specified disorders of bone density and structure, multiple sites: Secondary | ICD-10-CM | POA: Diagnosis not present

## 2017-06-18 DIAGNOSIS — Z780 Asymptomatic menopausal state: Secondary | ICD-10-CM | POA: Diagnosis not present

## 2017-06-20 DIAGNOSIS — R0982 Postnasal drip: Secondary | ICD-10-CM | POA: Diagnosis not present

## 2017-06-20 DIAGNOSIS — R0981 Nasal congestion: Secondary | ICD-10-CM | POA: Diagnosis not present

## 2017-06-25 DIAGNOSIS — K648 Other hemorrhoids: Secondary | ICD-10-CM | POA: Diagnosis not present

## 2017-06-25 DIAGNOSIS — Z1211 Encounter for screening for malignant neoplasm of colon: Secondary | ICD-10-CM | POA: Diagnosis not present

## 2017-06-25 DIAGNOSIS — K5730 Diverticulosis of large intestine without perforation or abscess without bleeding: Secondary | ICD-10-CM | POA: Diagnosis not present

## 2017-07-15 DIAGNOSIS — E538 Deficiency of other specified B group vitamins: Secondary | ICD-10-CM | POA: Diagnosis not present

## 2017-08-16 DIAGNOSIS — E538 Deficiency of other specified B group vitamins: Secondary | ICD-10-CM | POA: Diagnosis not present

## 2017-09-16 DIAGNOSIS — E538 Deficiency of other specified B group vitamins: Secondary | ICD-10-CM | POA: Diagnosis not present

## 2017-10-17 DIAGNOSIS — E538 Deficiency of other specified B group vitamins: Secondary | ICD-10-CM | POA: Diagnosis not present

## 2017-11-14 DIAGNOSIS — E538 Deficiency of other specified B group vitamins: Secondary | ICD-10-CM | POA: Diagnosis not present

## 2017-12-18 DIAGNOSIS — I10 Essential (primary) hypertension: Secondary | ICD-10-CM | POA: Diagnosis not present

## 2017-12-18 DIAGNOSIS — J449 Chronic obstructive pulmonary disease, unspecified: Secondary | ICD-10-CM | POA: Diagnosis not present

## 2017-12-18 DIAGNOSIS — E538 Deficiency of other specified B group vitamins: Secondary | ICD-10-CM | POA: Diagnosis not present

## 2017-12-18 DIAGNOSIS — R51 Headache: Secondary | ICD-10-CM | POA: Diagnosis not present

## 2017-12-18 DIAGNOSIS — F17210 Nicotine dependence, cigarettes, uncomplicated: Secondary | ICD-10-CM | POA: Diagnosis not present

## 2017-12-18 DIAGNOSIS — E782 Mixed hyperlipidemia: Secondary | ICD-10-CM | POA: Diagnosis not present

## 2018-01-27 ENCOUNTER — Ambulatory Visit (INDEPENDENT_AMBULATORY_CARE_PROVIDER_SITE_OTHER): Payer: Medicare Other | Admitting: Pulmonary Disease

## 2018-01-27 ENCOUNTER — Encounter: Payer: Self-pay | Admitting: Pulmonary Disease

## 2018-01-27 VITALS — BP 114/60 | HR 88 | Ht 65.5 in | Wt 148.0 lb

## 2018-01-27 DIAGNOSIS — I499 Cardiac arrhythmia, unspecified: Secondary | ICD-10-CM

## 2018-01-27 DIAGNOSIS — J3089 Other allergic rhinitis: Secondary | ICD-10-CM

## 2018-01-27 DIAGNOSIS — J438 Other emphysema: Secondary | ICD-10-CM | POA: Diagnosis not present

## 2018-01-27 MED ORDER — UMECLIDINIUM-VILANTEROL 62.5-25 MCG/INH IN AEPB: 1.0000 | INHALATION_SPRAY | Freq: Every day | RESPIRATORY_TRACT | 0 refills | Status: DC

## 2018-01-27 NOTE — Patient Instructions (Addendum)
Irregular heartbeat: We will check a twelve-lead EKG  COPD: Because your symptoms are worsening I want you to stop taking Incruise Take the sample of Anoro 1 puff daily for the next week.  If this improves your shortness of breath call us and we will change the prescription Get a flu shot in the fall Use albuterol as needed for chest tightness wheezing or shortness of breath  Allergic rhinitis: Take Flonase 2 sprays each nostril daily Take Zyrtec daily  Follow-up in 1 year or sooner if needed

## 2018-01-27 NOTE — Progress Notes (Signed)
Subjective:    Patient ID: Katrina Ball, female    DOB: 02-04-1946, 72 y.o.   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 Follow-up    1 year ROV    [name] says that this year she is having more mucus in her throat.  She can't tell if it is a sinus problem or not.  She says that she needed an antibiotic not too long ago because of this problem.  She reports pressure in her face, eyes, ears.  She still feels stopped.  She has been using claritin.  She "has a nose spray" but she doesn't use it.  She says that the breathing difficulty she has is mostly when her nose is stopped up.  She says that she doesn't feel like the Spiriva respimat works very well.  She says she can taste it and she doesn't think it helps muhc.    Past Medical History:  Diagnosis Date  . Anemia    "used to be" (12/23/2012)  . Anxiety   . Arrhythmia   . Arthritis   . Chest pain   . COPD (chronic obstructive pulmonary disease) ([place])   . Depression   . Exertional shortness of breath   . Fracture of tibial plateau, closed 12/23/2012  . GERD (gastroesophageal reflux disease)   . Heart murmur   . Hypertension   . Numbness and tingling in hands   . Sleep apnea       Review of Systems  Constitutional: Negative for chills, fatigue and fever.  HENT: Negative for postnasal drip, rhinorrhea and sinus pressure.   Respiratory: Positive for shortness of breath. Negative for cough and wheezing.   Cardiovascular: Negative for chest pain, palpitations and leg swelling.       Objective:   Physical Exam Vitals:   01/27/18 1142  BP: 114/60  Pulse: 88  SpO2: 94%  Weight: 148 lb (67.1 kg)  Height: 5' 5.5" (1.664 m)   RA  Gen: well appearing HENT: OP clear, TM's clear, neck supple PULM: Poor air movement, no wheezing, normal percussion CV: RRR, no mgr, trace edema GI: BS+, soft, nontender Derm: no cyanosis or  rash Psyche: normal mood and affect   RHC: RHC (6/16) with mean RA 6, PA 42/24 mean 32, mean PCWP 16, CI 2.69, PVR 3.4 WU.  Imaging: 2016 CT chest angiogram images personally reviewed showing no pulmonary embolism, mild atelectasis in the bases but no pulmonary parenchymal abnormality with the exception of some very mild centrilobular emphysema.  PFT: June 2016 pulmonary function testing ratio 52%, FEV1 0.87 L (47% predicted no change with albuterol), total lung capacity 4.70 L (95% predicted), DLCO 8.54 (37% predicted)   CBC    Component Value Date/Time   WBC 6.2 02/17/2015 0640   RBC 4.49 02/17/2015 0640   HGB 13.0 02/17/2015 0640   HCT 40.2 02/17/2015 0640   PLT 239 02/17/2015 0640   MCV 89.5 02/17/2015 0640   MCH 29.0 02/17/2015 0640   MCHC 32.3 02/17/2015 0640   RDW 15.9 (H) 02/17/2015 0640   LYMPHSABS 1.7 07/24/2010 2151   MONOABS 0.3 07/24/2010 2151   EOSABS 0.1 07/24/2010 2151   BASOSABS 0.0 07/24/2010 2151        Assessment & Plan:    Irregular heart beat - Plan: EKG 12-Lead  Other emphysema (HCC)  Allergic rhinitis due to other allergic trigger, unspecified  seasonality  Discussion: In the last year Roxane has dealt more with sinus congestion than with any sort of COPD exacerbation.  However, since changing from Spiriva to Incruse she has noticed worsening symptoms of shortness of breath.  Her insurance apparently has done some sort of a financial deal with [employer] so those are the preferred agents for her.  We will try using Anoro to see if this helps.  I have asked her to take that form of a sample and set of Incruse for the next few days.  Once again I reminded her that she needs to use her allergic rhinitis regimen routinely.  Plan Irregular heartbeat: We will check a twelve-lead EKG  COPD: Because your symptoms are worsening I want you to stop taking Incruise Take the sample of Anoro 1 puff daily for the next week.  If this improves your  shortness of breath call us and we will change the prescription Get a flu shot in the fall Use albuterol as needed for chest tightness wheezing or shortness of breath  Allergic rhinitis: Take Flonase 2 sprays each nostril daily Take Zyrtec daily  Follow-up in 1 year or sooner if needed    Current Outpatient Medications:  .  [name];[name], Inhale 2 puffs into the lungs as needed. Shortness of breathe, Disp: , Rfl:  .  [name], Take 2.5 mg by nebulization as needed. wheezing, Disp: , Rfl:  .  FLUoxetine (PROZAC) 20 MG capsule, Take 60 mg by mouth daily., Disp: , Rfl:  .  fluticasone (FLONASE) 50 MCG/ACT nasal spray, Place 2 sprays into both nostrils daily., Disp: , Rfl:  .  guaiFENesin-codeine (ROBITUSSIN AC) 100-10 MG/5ML syrup, Take 10-100 mg by mouth as needed. For cough, Disp: , Rfl:  .  loratadine (CLARITIN) 10 MG tablet, Take 10 mg by mouth daily., Disp: , Rfl:  .  olmesartan-hydrochlorothiazide (BENICAR HCT) 20-12.5 MG tablet, Take 1 tablet by mouth daily., Disp: , Rfl:  .  omeprazole (PRILOSEC) 40 MG capsule, Take 40 mg by mouth daily., Disp: , Rfl:  .  umeclidinium bromide (INCRUSE ELLIPTA) 62.5 MCG/INH AEPB, Inhale 1 puff into the lungs daily., Disp: , Rfl:  .  umeclidinium-vilanterol (ANORO ELLIPTA) 62.5-25 MCG/INH AEPB, Inhale 1 puff into the lungs daily., Disp: 2 each, Rfl: 0

## 2018-02-04 ENCOUNTER — Telehealth: Payer: Self-pay | Admitting: Pulmonary Disease

## 2018-02-04 MED ORDER — UMECLIDINIUM-VILANTEROL 62.5-25 MCG/INH IN AEPB: 1.0000 | INHALATION_SPRAY | Freq: Every day | RESPIRATORY_TRACT | 5 refills | Status: DC

## 2018-02-04 NOTE — Telephone Encounter (Signed)
Spoke with pt. She is requesting a prescription for Anoro to be sent to her pharmacy. At her last OV, she was given a sample of this and it's working well. Rx has been sent in. While on the phone she also requested a prescription for Hycodan. Advised her that I would send her request over to Dr. Lake Bells.  Dr. Lake Bells - please advise. Thanks.

## 2018-02-05 NOTE — Telephone Encounter (Signed)
OK by me with Anoro, no to [facility]

## 2018-02-05 NOTE — Telephone Encounter (Signed)
lmtcb x1 for pt. 

## 2018-02-06 MED ORDER — UMECLIDINIUM-VILANTEROL 62.5-25 MCG/INH IN AEPB: 1.0000 | INHALATION_SPRAY | Freq: Every day | RESPIRATORY_TRACT | 5 refills | Status: DC

## 2018-02-06 NOTE — Telephone Encounter (Signed)
lmtcb x2 for pt. 

## 2018-02-06 NOTE — Telephone Encounter (Signed)
Patient returned, CB is [phone]

## 2018-02-06 NOTE — Telephone Encounter (Signed)
Spoke with pt. She is aware of Dr. Anastasia Pall response. Rx for Anoro has been sent in. Nothing further was needed.

## 2018-04-08 DIAGNOSIS — J0110 Acute frontal sinusitis, unspecified: Secondary | ICD-10-CM | POA: Diagnosis not present

## 2018-04-08 DIAGNOSIS — J0100 Acute maxillary sinusitis, unspecified: Secondary | ICD-10-CM | POA: Diagnosis not present

## 2018-06-18 DIAGNOSIS — F17210 Nicotine dependence, cigarettes, uncomplicated: Secondary | ICD-10-CM | POA: Diagnosis not present

## 2018-06-18 DIAGNOSIS — I10 Essential (primary) hypertension: Secondary | ICD-10-CM | POA: Diagnosis not present

## 2018-06-18 DIAGNOSIS — J329 Chronic sinusitis, unspecified: Secondary | ICD-10-CM | POA: Diagnosis not present

## 2018-06-18 DIAGNOSIS — H6502 Acute serous otitis media, left ear: Secondary | ICD-10-CM | POA: Diagnosis not present

## 2018-06-18 DIAGNOSIS — E782 Mixed hyperlipidemia: Secondary | ICD-10-CM | POA: Diagnosis not present

## 2018-06-18 DIAGNOSIS — Z1231 Encounter for screening mammogram for malignant neoplasm of breast: Secondary | ICD-10-CM | POA: Diagnosis not present

## 2018-06-26 DIAGNOSIS — Z1231 Encounter for screening mammogram for malignant neoplasm of breast: Secondary | ICD-10-CM | POA: Diagnosis not present

## 2018-07-10 DIAGNOSIS — J0111 Acute recurrent frontal sinusitis: Secondary | ICD-10-CM | POA: Diagnosis not present

## 2018-09-07 ENCOUNTER — Other Ambulatory Visit: Payer: Self-pay | Admitting: Pulmonary Disease

## 2019-03-02 ENCOUNTER — Other Ambulatory Visit: Payer: Self-pay | Admitting: Pulmonary Disease

## 2019-06-25 ENCOUNTER — Telehealth: Payer: Self-pay | Admitting: Pulmonary Disease

## 2019-06-25 NOTE — Telephone Encounter (Signed)
Pt would need an OV or telephone visit before her medications could be changed.  ATC pt, received a fast busy signal x2. Will try back.

## 2019-06-26 NOTE — Telephone Encounter (Signed)
ATC pt, received a busy signal x2. °Will try back. °

## 2019-06-29 ENCOUNTER — Other Ambulatory Visit: Payer: Self-pay

## 2019-06-29 ENCOUNTER — Encounter: Payer: Self-pay | Admitting: Pulmonary Disease

## 2019-06-29 ENCOUNTER — Ambulatory Visit (INDEPENDENT_AMBULATORY_CARE_PROVIDER_SITE_OTHER): Payer: Medicare Other | Admitting: Pulmonary Disease

## 2019-06-29 VITALS — BP 120/60 | HR 80 | Temp 98.4°F | Ht 65.5 in | Wt 134.0 lb

## 2019-06-29 DIAGNOSIS — J438 Other emphysema: Secondary | ICD-10-CM | POA: Diagnosis not present

## 2019-06-29 DIAGNOSIS — J3089 Other allergic rhinitis: Secondary | ICD-10-CM

## 2019-06-29 DIAGNOSIS — Z720 Tobacco use: Secondary | ICD-10-CM

## 2019-06-29 DIAGNOSIS — H539 Unspecified visual disturbance: Secondary | ICD-10-CM

## 2019-06-29 DIAGNOSIS — F17210 Nicotine dependence, cigarettes, uncomplicated: Secondary | ICD-10-CM | POA: Diagnosis not present

## 2019-06-29 MED ORDER — BREO ELLIPTA 100-25 MCG/INH IN AEPB: 1.0000 | INHALATION_SPRAY | Freq: Every day | RESPIRATORY_TRACT | 0 refills | Status: DC

## 2019-06-29 NOTE — Progress Notes (Signed)
@Patient  ID: Katrina Ball, female    DOB: 01-Jul-1946, 73 y.o.   MRN: [id number]  Chief Complaint  Patient presents with   Follow-up    Wants to discuss switching from Anoro to another inhaler after side effects.     Referring provider: Bartholome Bill, MD  HPI:  73 year old female smoker followed in our office for COPD  PMH: Hypertension, pulmonary hypertension Smoker/ Smoking History: Current smoker.  3 to 5 cigarettes a day, 11.25-pack-year smoking history Maintenance: Anoro Ellipta Pt of: Dr. Lake Bells  06/29/2019  - Visit   73 year old female current every day smoker presenting to our office today for a follow-up visit.  Patient reporting to our office today to discuss her inhaler use.  Patient reports that she started to have acute vision changes.  She describes these vision changes as eye tiredness, eye fatigue, floaters occasionally, and eyes being very dry in the morning.  She is under the suspicion that this is being caused by her Anoro Ellipta.  Patient does wear glasses at baseline.  She has not had any evaluation is here for vision has changed or worsen.  She is interested in being referred to an ophthalmologist in the area.  She reports that she frequently goes to [facility] location such as [facility].  She does not have good consistent follow-up with ophthalmology in the area.  Patient stopped taking Anoro Ellipta 1 week ago.  She reports that she is maintained on her rescue inhaler now typically taking this daily.  She did feel that Anoro Ellipta did symptomatically help her breathing.  Her 2016 pulmonary function test showed severe COPD, with an FEV1 less than 1 L.  She has continued to smoke since then.  Questionaires / Pulmonary Flowsheets:   MMRC: mMRC Dyspnea Scale mMRC Score  06/29/2019 2    Tests:   RHC: RHC (6/16) with mean RA 6, PA 42/24 mean 32, mean PCWP 16, CI 2.69, PVR 3.4 WU.  Imaging: 2016 CT chest angiogram showing no pulmonary  embolism, mild atelectasis in the bases but no pulmonary parenchymal abnormality with the exception of some very mild centrilobular emphysema.  PFT: June 2016 pulmonary function testing ratio 52%, FEV1 0.87 L (47% predicted no change with albuterol), total lung capacity 4.70 L (95% predicted), DLCO 8.54 (37% predicted)  FENO:  No results found for: NITRICOXIDE  PFT: PFT Results Latest Ref Rng & Units 02/16/2015  FVC-Pre L 1.70  FVC-Predicted Pre % 72  FVC-Post L 1.66  FVC-Predicted Post % 70  Pre FEV1/FVC % % 51  Post FEV1/FCV % % 52  FEV1-Pre L 0.86  FEV1-Predicted Pre % 47  FEV1-Post L 0.87  DLCO UNC% % 37  DLCO COR %Predicted % 65  TLC L 4.70  TLC % Predicted % 95  RV % Predicted % 143    WALK:  SIX MIN WALK 04/18/2015  Supplimental Oxygen during Test? (L/min) No    Imaging: No results found.  Lab Results:  CBC    Component Value Date/Time   WBC 6.2 02/17/2015 0640   RBC 4.49 02/17/2015 0640   HGB 13.0 02/17/2015 0640   HCT 40.2 02/17/2015 0640   PLT 239 02/17/2015 0640   MCV 89.5 02/17/2015 0640   MCH 29.0 02/17/2015 0640   MCHC 32.3 02/17/2015 0640   RDW 15.9 (H) 02/17/2015 0640   LYMPHSABS 1.7 07/24/2010 2151   MONOABS 0.3 07/24/2010 2151   EOSABS 0.1 07/24/2010 2151   BASOSABS 0.0 07/24/2010 2151  BMET    Component Value Date/Time   NA 135 02/17/2015 0640   K 3.7 02/17/2015 0640   CL 101 02/17/2015 0640   CO2 28 02/17/2015 0640   GLUCOSE 114 (H) 02/17/2015 0640   BUN 15 02/17/2015 0640   CREATININE 1.20 (H) 02/17/2015 0640   CALCIUM 9.5 02/17/2015 0640   GFRNONAA 46 (L) 02/17/2015 0640   GFRAA 53 (L) 02/17/2015 0640    BNP No results found for: BNP  ProBNP No results found for: PROBNP  Specialty Problems      Pulmonary Problems   Allergic rhinitis   COPD (chronic obstructive pulmonary disease) ([place])    June 2016 pulmonary function testing FEV1 0.87 L (cc 47% predicted), clear airflow obstruction, total lung capacity 4.70 L ((95%  protected) disease, DLCO 37% predicted      SOB (shortness of breath)      No Known Allergies  Immunization History  Administered Date(s) Administered   Influenza Split 05/29/2016   Influenza, High Dose Seasonal PF 05/08/2019   Influenza,inj,Quad PF,6+ Mos 05/31/2015   Pneumococcal Conjugate-13 12/15/2015   Pneumococcal Polysaccharide-23 10/05/2013    Past Medical History:  Diagnosis Date   Anemia    "used to be" (12/23/2012)   Anxiety    Arrhythmia    Arthritis    Chest pain    COPD (chronic obstructive pulmonary disease) (HCC)    Depression    Exertional shortness of breath    Fracture of tibial plateau, closed 12/23/2012   GERD (gastroesophageal reflux disease)    Heart murmur    Hypertension    Numbness and tingling in hands    Sleep apnea     Tobacco History: Social History   Tobacco Use  Smoking Status Current Every Day Smoker   Packs/day: 0.25   Years: 45.00   Pack years: 11.25   Types: Cigarettes  Smokeless Tobacco Never Used  Tobacco Comment   3-4 cigarettes per day 06/29/19   Ready to quit: Yes Counseling given: Yes Comment: 3-4 cigarettes per day 06/29/19  Smoking assessment and cessation counseling  Patient currently smoking: 3-5 a day  I have advised the patient to quit/stop smoking as soon as possible due to high risk for multiple medical problems.  It will also be very difficult for [place] to manage patient's  respiratory symptoms and status if we continue to expose her lungs to a known irritant.  We do not advise e-cigarettes as a form of stopping smoking.  Patient is not willing to quit smoking. Hasnt set quit date.   I have advised the patient that we can assist and have options of nicotine replacement therapy, provided smoking cessation education today, provided smoking cessation counseling, and provided cessation resources.  Follow-up next office visit office visit for assessment of smoking cessation.  Smoking cessation  counseling advised for: 5 min    Outpatient Encounter Medications as of 06/29/2019  Medication Sig   [name];[name] Inhale 2 puffs into the lungs as needed. Shortness of breathe   [name] Take 2.5 mg by nebulization as needed. wheezing   ANORO ELLIPTA 62.5-25 MCG/INH AEPB INHALE 1 PUFF BY MOUTH EVERY DAY   FLUoxetine (PROZAC) 20 MG capsule Take 60 mg by mouth daily.   fluticasone (FLONASE) 50 MCG/ACT nasal spray Place 2 sprays into both nostrils daily.   loratadine (CLARITIN) 10 MG tablet Take 10 mg by mouth daily.   olmesartan-hydrochlorothiazide (BENICAR HCT) 20-12.5 MG  tablet Take 1 tablet by mouth daily.   omeprazole (PRILOSEC) 40 MG capsule Take 40 mg by mouth daily.   fluticasone furoate-vilanterol (BREO ELLIPTA) 100-25 MCG/INH AEPB Inhale 1 puff into the lungs daily.   [DISCONTINUED] guaiFENesin-codeine (ROBITUSSIN AC) 100-10 MG/5ML syrup Take 10-100 mg by mouth as needed. For cough   [DISCONTINUED] umeclidinium bromide (INCRUSE ELLIPTA) 62.5 MCG/INH AEPB Inhale 1 puff into the lungs daily.   No facility-administered encounter medications on file as of 06/29/2019.      Review of Systems  Review of Systems  Constitutional: Negative for activity change, fatigue and fever.  HENT: Negative for sinus pressure, sinus pain and sore throat.   Eyes: Positive for visual disturbance.  Respiratory: Negative for cough, shortness of breath and wheezing.   Cardiovascular: Negative for chest pain and palpitations.  Gastrointestinal: Negative for diarrhea, nausea and vomiting.  Musculoskeletal: Negative for arthralgias.  Neurological: Negative for dizziness.  Psychiatric/Behavioral: Negative for sleep disturbance. The patient is not nervous/anxious.      Physical Exam  BP 120/60    Pulse 80    Temp 98.4 F (36.9 C) (Temporal)    Ht 5' 5.5" (1.664 m)    Wt 134 lb (60.8 kg)     SpO2 97%    BMI 21.96 kg/m   Wt Readings from Last 5 Encounters:  06/29/19 134 lb (60.8 kg)  01/27/18 148 lb (67.1 kg)  12/03/16 147 lb (66.7 kg)  06/19/16 150 lb (68 kg)  12/15/15 131 lb (59.4 kg)    BMI Readings from Last 5 Encounters:  06/29/19 21.96 kg/m  01/27/18 24.25 kg/m  12/03/16 26.04 kg/m  06/19/16 26.57 kg/m  12/15/15 23.21 kg/m    Physical Exam Vitals signs and nursing note reviewed.  Constitutional:      General: She is not in acute distress.    Appearance: Normal appearance. She is normal weight.  HENT:     Head: Normocephalic and atraumatic.     Right Ear: Tympanic membrane, ear canal and external ear normal. There is no impacted cerumen.     Left Ear: Tympanic membrane, ear canal and external ear normal. There is no impacted cerumen.     Nose: Nose normal. No congestion or rhinorrhea.     Mouth/Throat:     Mouth: Mucous membranes are moist.     Pharynx: Oropharynx is clear.  Eyes:     Extraocular Movements: Extraocular movements intact.     Pupils: Pupils are equal, round, and reactive to light.     Comments: Currently wearing eyeglasses  Neck:     Musculoskeletal: Normal range of motion.  Cardiovascular:     Rate and Rhythm: Normal rate and regular rhythm.     Pulses: Normal pulses.     Heart sounds: No murmur.     Comments: Distant heart tones Pulmonary:     Effort: Pulmonary effort is normal. No respiratory distress.     Breath sounds: No decreased air movement. No decreased breath sounds, wheezing or rales.     Comments: Diminished breath sounds Abdominal:     General: Abdomen is flat. Bowel sounds are normal.     Palpations: Abdomen is soft.  Skin:    General: Skin is warm and dry.     Capillary Refill: Capillary refill takes less than 2 seconds.  Neurological:     General: No focal deficit present.     Mental Status: She is alert and oriented to person, place, and time. Mental status is at baseline.  Gait: Gait normal.    Psychiatric:        Mood and Affect: Mood normal.        Behavior: Behavior normal.        Thought Content: Thought content normal.        Judgment: Judgment normal.       Assessment & Plan:   Allergic rhinitis Plan: Continue Claritin daily Continue Flonase    COPD (chronic obstructive pulmonary disease) Stable interval Patient felt that LABA/LAMA helped with her breathing Patient continues to smoke Patient suspecting that vision changes may be related to her LAMA inhaler  Plan: Stop Anoro Ellipta Start/trial Breo Ellipta 100 >>> No peripheral eosinophilia in lab work from 2011, no bronchodilator response on 2016 PFTs Emphasized need to stop smoking Patient does not feel that she is ready to stop smoking at this time Patient needs to establish care with Dr. Carlis Abbott in our office, this will be a 30-minute office visit  Tobacco abuse Plan: Discussed with patient today the importance of stopping smoking.  She is not willing to quit at this time. Patient is aware to contact her office if she does become interested.  Also discussed with patient that we can coordinate having a office visit with the clinical pharmacy team to review smoking cessation if she becomes interested.  Vision changes Recent changes in vision over the last 3 to 6 months Patient is unsure if this is related to vision changes with age or due to her LAMA inhaler Vision has improved over the last week since stopping LAMA inhaler Patient does not have a eye doctor in the area for routine follow-up  Plan: Ambulatory referral to [facility] Okay to stop Anoro Ellipta    Return in about 4 weeks (around 07/27/2019), or if symptoms worsen or fail to improve, for Follow up with Dr. Carlis Abbott - 35min ov .   Lauraine Rinne, NP 06/29/2019   This appointment was 26 minutes long with over 50% of the time in direct face-to-face patient care, assessment, plan of care, and follow-up.

## 2019-06-29 NOTE — Assessment & Plan Note (Signed)
Stable interval Patient felt that LABA/LAMA helped with her breathing Patient continues to smoke Patient suspecting that vision changes may be related to her LAMA inhaler  Plan: Stop Anoro Ellipta Start/trial Breo Ellipta 100 >>> No peripheral eosinophilia in lab work from 2011, no bronchodilator response on 2016 PFTs Emphasized need to stop smoking Patient does not feel that she is ready to stop smoking at this time Patient needs to establish care with Dr. Carlis Abbott in our office, this will be a 30-minute office visit

## 2019-06-29 NOTE — Assessment & Plan Note (Signed)
Plan: Continue Claritin daily Continue Flonase

## 2019-06-29 NOTE — Assessment & Plan Note (Signed)
Plan: Discussed with patient today the importance of stopping smoking.  She is not willing to quit at this time. Patient is aware to contact her office if she does become interested.  Also discussed with patient that we can coordinate having a office visit with the clinical pharmacy team to review smoking cessation if she becomes interested.

## 2019-06-29 NOTE — Patient Instructions (Addendum)
You were seen today by Lauraine Rinne, NP  for:   1. Vision changes  - Ambulatory referral to Ophthalmology  We will refer you today to [facility] for further evaluation  Please contact our office if you are not getting scheduled with their office in the next couple of days  Okay to stop Anoro Ellipta  2. Allergic rhinitis due to other allergic trigger, unspecified seasonality  3. Other emphysema ([place])  Stop Anoro Ellipta  Start Breo Ellipta 100 >>> Take 1 puff daily in the morning right when you wake up >>>Rinse your mouth out after use >>>This is a daily maintenance inhaler, NOT a rescue inhaler >>>Contact our office if you are having difficulties affording or obtaining this medication >>>It is important for you to be able to take this daily and not miss any doses   If you like this medication please contact your office and we can send a prescription in  Only use your albuterol as a rescue medication to be used if you can't catch your breath by resting or doing a relaxed purse lip breathing pattern.  - The less you use it, the better it will work when you need it. - Ok to use up to 2 puffs  every 4 hours if you must but call for immediate appointment if use goes up over your usual need - Don't leave home without it !!  (think of it like the spare tire for your car)      Note your daily symptoms > remember "red flags" for COPD:   >>>Increase in cough >>>increase in sputum production >>>increase in shortness of breath or activity  intolerance.   If you notice these symptoms, please call the office to be seen.     4. Tobacco abuse  We recommend that you stop smoking.  >>>You need to set a quit date >>>If you have friends or family who smoke, let them know you are trying to quit and not to smoke around you or in your living environment  Smoking Cessation Resources:  1 800 QUIT NOW  >>> Patient to call this resource and utilize it to help support her quit  smoking >>> Keep up your hard work with stopping smoking  You can also contact the [facility] >>>For smoking cessation classes call 3234034074  We do not recommend using e-cigarettes as a form of stopping smoking  You can sign up for smoking cessation support texts and information:  >>>https://smokefree.gov/smokefreetxt     We recommend today:  Orders Placed This Encounter  Procedures  . Ambulatory referral to Ophthalmology    Referral Priority:   Routine    Referral Type:   Consultation    Referral Reason:   Specialty Services Required    Requested Specialty:   Ophthalmology    Number of Visits Requested:   1   Orders Placed This Encounter  Procedures  . Ambulatory referral to Ophthalmology   No orders of the defined types were placed in this encounter.   Follow Up:     Return in about 4 weeks (around 07/27/2019), or if symptoms worsen or fail to improve, for Follow up with Dr. Carlis Abbott - 82min ov .   Please do your part to reduce the spread of COVID-19:      Reduce your risk of any infection  and COVID19 by using the similar precautions used for avoiding the common cold or flu:  Marland Kitchen Wash your hands often with soap and  warm water for at least 20 seconds.  If soap and water are not readily available, use an alcohol-based hand sanitizer with at least 60% alcohol.  . If coughing or sneezing, cover your mouth and nose by coughing or sneezing into the elbow areas of your shirt or coat, into a tissue or into your sleeve (not your hands). Langley Gauss A MASK when in public  . Avoid shaking hands with others and consider head nods or verbal greetings only. . Avoid touching your eyes, nose, or mouth with unwashed hands.  . Avoid close contact with people who are sick. . Avoid places or events with large numbers of people in one location, like concerts or sporting events. . If you have some symptoms but not all symptoms, continue to monitor at home and seek medical  attention if your symptoms worsen. . If you are having a medical emergency, call 911.   [place] / e-Visit: [email or web address]         [facility]: [facility]: [phone]                   [facility]: [phone]     It is flu season:   >>> Best ways to protect herself from the flu: Receive the yearly flu vaccine, practice good hand hygiene washing with soap and also using hand sanitizer when available, eat a nutritious meals, get adequate rest, hydrate appropriately   Please contact the office if your symptoms worsen or you have concerns that you are not improving.   Thank you for choosing [facility] for your healthcare, and for allowing [place] to partner with you on your healthcare journey. I am thankful to be able to provide care to you today.   Wyn Quaker FNP-C

## 2019-06-29 NOTE — Assessment & Plan Note (Signed)
Recent changes in vision over the last 3 to 6 months Patient is unsure if this is related to vision changes with age or due to her LAMA inhaler Vision has improved over the last week since stopping LAMA inhaler Patient does not have a eye doctor in the area for routine follow-up  Plan: Ambulatory referral to [facility] Okay to stop Anoro Ellipta

## 2019-06-29 NOTE — Telephone Encounter (Signed)
Spoke with pt. She has been scheduled to see Aaron Edelman today at 1530. Nothing further was needed.

## 2019-07-02 NOTE — Progress Notes (Signed)
Reviewed, agree 

## 2019-07-13 ENCOUNTER — Ambulatory Visit: Payer: Medicare Other | Admitting: Critical Care Medicine

## 2019-07-13 NOTE — Progress Notes (Deleted)
Synopsis: Referred in August 2016 for emphysema by Bartholome Bill, MD.  Previously patient of Dr. Lake Bells.  Subjective:   PATIENT ID: Katrina Ball GENDER: female DOB: 05-15-1946, MRN: [id number]  No chief complaint on file.   HPI  Up-to-date on flu shot and both pneumonia vaccines  Allergic rhinitis-Claritin and Flonase  COPD-switched from Anoro to [facility] due to vision changes, better off LAMA, referred to [facility] eye care  Ongoing tobacco abuse   Past Medical History:  Diagnosis Date  . Anemia    "used to be" (12/23/2012)  . Anxiety   . Arrhythmia   . Arthritis   . Chest pain   . COPD (chronic obstructive pulmonary disease) ([place])   . Depression   . Exertional shortness of breath   . Fracture of tibial plateau, closed 12/23/2012  . GERD (gastroesophageal reflux disease)   . Heart murmur   . Hypertension   . Numbness and tingling in hands   . Sleep apnea      Family History  Problem Relation Age of Onset  . CVA Mother   . Heart failure Mother   . Heart disease Mother   . Asthma Mother      Past Surgical History:  Procedure Laterality Date  . CARDIAC CATHETERIZATION N/A 02/17/2015   Procedure: Right Heart Cath;  Surgeon: Larey Dresser, MD;  Location: [place] [facility];  Service: Cardiovascular;  Laterality: N/A;  . INCISION AND DRAINAGE OF WOUND  12/07/2012   "LLE" (12/23/2012)  . ORIF PROXIMAL TIBIAL PLATEAU FRACTURE Left 12/23/2012  . ORIF TIBIA PLATEAU Left 12/23/2012   Procedure: OPEN REDUCTION INTERNAL FIXATION (ORIF) LEFT TIBIAL PLATEAU;  Surgeon: Johnny Bridge, MD;  Location: [place];  Service: Orthopedics;  Laterality: Left;  . TUBAL LIGATION      Social History   Socioeconomic History  . Marital status: Widowed    Spouse name: Not on file  . Number of children: 3  . Years of education: Not on file  . Highest education level: Not on file  Occupational History  . Occupation: retired  Scientific laboratory technician  . Financial resource strain: Not on file  .  Food insecurity    Worry: Not on file    Inability: Not on file  . Transportation needs    Medical: Not on file    Non-medical: Not on file  Tobacco Use  . Smoking status: Current Every Day Smoker    Packs/day: 0.25    Years: 45.00    Pack years: 11.25    Types: Cigarettes  . Smokeless tobacco: Never Used  . Tobacco comment: 3-4 cigarettes per day 06/29/19  Substance and Sexual Activity  . Alcohol use: Yes    Alcohol/week: 9.0 standard drinks    Types: 3 Cans of beer, 6 Shots of liquor per week    Comment: 12/23/2012 "alcohol intake varys greatly"  . Drug use: No  . Sexual activity: Never  Lifestyle  . Physical activity    Days per week: Not on file    Minutes per session: Not on file  . Stress: Not on file  Relationships  . Social Herbalist on phone: Not on file    Gets together: Not on file    Attends religious service: Not on file    Active member of club or organization: Not on file    Attends meetings of clubs or organizations: Not on file    Relationship status: Not on file  .  Intimate partner violence    Fear of current or ex partner: Not on file    Emotionally abused: Not on file    Physically abused: Not on file    Forced sexual activity: Not on file  Other Topics Concern  . Not on file  Social History Narrative  . Not on file     No Known Allergies   Immunization History  Administered Date(s) Administered  . Influenza Split 05/29/2016  . Influenza, High Dose Seasonal PF 05/08/2019  . Influenza,inj,Quad PF,6+ Mos 05/31/2015  . Pneumococcal Conjugate-13 12/15/2015  . Pneumococcal Polysaccharide-23 10/05/2013    Outpatient Medications Prior to Visit  Medication Sig Dispense Refill  . [name];[name] Inhale 2 puffs into the lungs as needed. Shortness of breathe    . [name] Take 2.5 mg by nebulization as needed. wheezing    . ANORO ELLIPTA  62.5-25 MCG/INH AEPB INHALE 1 PUFF BY MOUTH EVERY DAY 60 each 5  . FLUoxetine (PROZAC) 20 MG capsule Take 60 mg by mouth daily.    . fluticasone (FLONASE) 50 MCG/ACT nasal spray Place 2 sprays into both nostrils daily.    . fluticasone furoate-vilanterol (BREO ELLIPTA) 100-25 MCG/INH AEPB Inhale 1 puff into the lungs daily. 1 each 0  . loratadine (CLARITIN) 10 MG tablet Take 10 mg by mouth daily.    Marland Kitchen olmesartan-hydrochlorothiazide (BENICAR HCT) 20-12.5 MG tablet Take 1 tablet by mouth daily.    Marland Kitchen omeprazole (PRILOSEC) 40 MG capsule Take 40 mg by mouth daily.     No facility-administered medications prior to visit.     ROS   Objective:  There were no vitals filed for this visit.   on *** LPM *** RA BMI Readings from Last 3 Encounters:  06/29/19 21.96 kg/m  01/27/18 24.25 kg/m  12/03/16 26.04 kg/m   Wt Readings from Last 3 Encounters:  06/29/19 134 lb (60.8 kg)  01/27/18 148 lb (67.1 kg)  12/03/16 147 lb (66.7 kg)    Physical Exam    Chest Imaging- films reviewed: CTA chest 01/13/2015-centrilobular emphysema, airway thickening, linear scar in RML.  Nodule in lateral RML.  Linear scars in bilateral lower lobes.  Posterior diaphragmatic eventration on the left.  No significant mediastinal or hilar adenopathy.  Pulmonary Functions Testing Results: PFT Results Latest Ref Rng & Units 02/16/2015  FVC-Pre L 1.70  FVC-Predicted Pre % 72  FVC-Post L 1.66  FVC-Predicted Post % 70  Pre FEV1/FVC % % 51  Post FEV1/FCV % % 52  FEV1-Pre L 0.86  FEV1-Predicted Pre % 47  FEV1-Post L 0.87  DLCO UNC% % 37  DLCO COR %Predicted % 65  TLC L 4.70  TLC % Predicted % 95  RV % Predicted % 143   Moderate obstruction, severe diffusion impairment   Echocardiogram 01/05/2015: LVEF 55 to 29%, diastolic dysfunction.  Moderate TR.  Normal LA, RV.  Elevated PASP.  Normal mitral and aortic valves.    Heart Catheterization:  RHC 02/17/2015: RHC (6/16) with mean RA 6, PA 42/24 mean 32, mean PCWP  16, CI 2.69, PVR 3.4 WU. 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 Tobacco abuse  Z72.0   2. Allergic rhinitis due to other allergic trigger, unspecified seasonality  J30.89   3. Other emphysema ([place])  J43.8  Current Outpatient Medications:  .  [name];[name], Inhale 2 puffs into the lungs as needed. Shortness of breathe, Disp: , Rfl:  .  [name], Take 2.5 mg by nebulization as needed. wheezing, Disp: , Rfl:  .  ANORO ELLIPTA 62.5-25 MCG/INH AEPB, INHALE 1 PUFF BY MOUTH EVERY DAY, Disp: 60 each, Rfl: 5 .  FLUoxetine (PROZAC) 20 MG capsule, Take 60 mg by mouth daily., Disp: , Rfl:  .  fluticasone (FLONASE) 50 MCG/ACT nasal spray, Place 2 sprays into both nostrils daily., Disp: , Rfl:  .  fluticasone furoate-vilanterol (BREO ELLIPTA) 100-25 MCG/INH AEPB, Inhale 1 puff into the lungs daily., Disp: 1 each, Rfl: 0 .  loratadine (CLARITIN) 10 MG tablet, Take 10 mg by mouth daily., Disp: , Rfl:  .  olmesartan-hydrochlorothiazide (BENICAR HCT) 20-12.5 MG tablet, Take 1 tablet by mouth daily., Disp: , Rfl:  .  omeprazole (PRILOSEC) 40 MG capsule, Take 40 mg by mouth daily., Disp: , Rfl:    Julian Hy, DO [place] [facility] 07/13/2019 8:37 AM

## 2019-07-14 ENCOUNTER — Encounter: Payer: Self-pay | Admitting: Critical Care Medicine

## 2019-07-14 ENCOUNTER — Other Ambulatory Visit: Payer: Self-pay

## 2019-07-14 ENCOUNTER — Ambulatory Visit (INDEPENDENT_AMBULATORY_CARE_PROVIDER_SITE_OTHER): Payer: Medicare Other | Admitting: Critical Care Medicine

## 2019-07-14 VITALS — BP 142/70 | HR 88 | Temp 97.8°F | Ht 65.0 in | Wt 143.8 lb

## 2019-07-14 DIAGNOSIS — R911 Solitary pulmonary nodule: Secondary | ICD-10-CM

## 2019-07-14 DIAGNOSIS — J438 Other emphysema: Secondary | ICD-10-CM | POA: Diagnosis not present

## 2019-07-14 DIAGNOSIS — Z720 Tobacco use: Secondary | ICD-10-CM | POA: Diagnosis not present

## 2019-07-14 MED ORDER — VARENICLINE TARTRATE 1 MG PO TABS: 1.0000 mg | ORAL_TABLET | Freq: Two times a day (BID) | ORAL | 4 refills | Status: DC

## 2019-07-14 MED ORDER — VARENICLINE TARTRATE 0.5 MG PO TABS: 0.5000 mg | ORAL_TABLET | Freq: Two times a day (BID) | ORAL | 0 refills | Status: DC

## 2019-07-14 NOTE — Progress Notes (Signed)
Synopsis: Referred in August 2016 for emphysema by Bartholome Bill, MD.  Previously patient of Dr. Lake Bells.  Subjective:   PATIENT ID: Katrina Ball: 1946/07/11, MRN: [id number]  Chief Complaint  Patient presents with  . Consult    Patient is here to establish care. Patient states her cough has got better since getting tesslon perals. Patient was strated on Breo inhaler and felt like it wasn't helping but isn't sure if she is using it correct.    Katrina Ball is a 73 year old woman who presents for follow-up of COPD.  She recently had exacerbation associated with sinusitis that resolved with ampicillin-her green mucus and sinus congestion have improved.  Due to eye symptoms, she was recently switched from Anoro to [place], but she does not feel that her breathing is as well controlled on Breo.  She describes her eyes as having been dry.  She saw [facility] last week and has a follow-up appointment in 6 months.  They did not recommend that she remain off of a LAMA.  She continues to have postnasal drip, and has been using Flonase, but has not been taking Claritin.  Her COPD symptoms are overall well controlled, she denies cough, sputum, and only has occasional wheezing.  She has dyspnea on exertion that limits her activity to walking about 1 block.  She continues to smoke 3 to 4 cigarettes/day, some days more if she is having a bad day.  Her heavy smoking was 6 cigarettes/day x 35 years.  She is thinking about quitting but does not think she is quite ready.  She has never been hospitalized for COPD.       Past Medical History:  Diagnosis Date  . Anemia    "used to be" (12/23/2012)  . Anxiety   . Arrhythmia   . Arthritis   . Chest pain   . COPD (chronic obstructive pulmonary disease) ([place])   . Depression   . Exertional shortness of breath   . Fracture of tibial plateau, closed 12/23/2012  . GERD (gastroesophageal reflux disease)   . Heart murmur   . Hypertension    . Numbness and tingling in hands   . Sleep apnea      Family History  Problem Relation Age of Onset  . CVA Mother   . Heart failure Mother   . Heart disease Mother   . Asthma Mother      Past Surgical History:  Procedure Laterality Date  . CARDIAC CATHETERIZATION N/A 02/17/2015   Procedure: Right Heart Cath;  Surgeon: Larey Dresser, MD;  Location: [place] [facility];  Service: Cardiovascular;  Laterality: N/A;  . INCISION AND DRAINAGE OF WOUND  12/07/2012   "LLE" (12/23/2012)  . ORIF PROXIMAL TIBIAL PLATEAU FRACTURE Left 12/23/2012  . ORIF TIBIA PLATEAU Left 12/23/2012   Procedure: OPEN REDUCTION INTERNAL FIXATION (ORIF) LEFT TIBIAL PLATEAU;  Surgeon: Johnny Bridge, MD;  Location: [place];  Service: Orthopedics;  Laterality: Left;  . TUBAL LIGATION      Social History   Socioeconomic History  . Marital status: Widowed    Spouse name: Not on file  . Number of children: 3  . Years of education: Not on file  . Highest education level: Not on file  Occupational History  . Occupation: retired  Scientific laboratory technician  . Financial resource strain: Not on file  . Food insecurity    Worry: Not on file    Inability: Not on file  .  Transportation needs    Medical: Not on file    Non-medical: Not on file  Tobacco Use  . Smoking status: Current Every Day Smoker    Packs/day: 0.25    Years: 45.00    Pack years: 11.25    Types: Cigarettes  . Smokeless tobacco: Never Used  . Tobacco comment: 3-4 cigarettes per day 06/29/19  Substance and Sexual Activity  . Alcohol use: Yes    Alcohol/week: 9.0 standard drinks    Types: 3 Cans of beer, 6 Shots of liquor per week    Comment: 12/23/2012 "alcohol intake varys greatly"  . Drug use: No  . Sexual activity: Never  Lifestyle  . Physical activity    Days per week: Not on file    Minutes per session: Not on file  . Stress: Not on file  Relationships  . Social Herbalist on phone: Not on file    Gets together: Not on file    Attends  religious service: Not on file    Active member of club or organization: Not on file    Attends meetings of clubs or organizations: Not on file    Relationship status: Not on file  . Intimate partner violence    Fear of current or ex partner: Not on file    Emotionally abused: Not on file    Physically abused: Not on file    Forced sexual activity: Not on file  Other Topics Concern  . Not on file  Social History Narrative  . Not on file     No Known Allergies   Immunization History  Administered Date(s) Administered  . Influenza Split 05/29/2016  . Influenza, High Dose Seasonal PF 05/08/2019  . Influenza,inj,Quad PF,6+ Mos 05/31/2015  . Pneumococcal Conjugate-13 12/15/2015  . Pneumococcal Polysaccharide-23 10/05/2013    Outpatient Medications Prior to Visit  Medication Sig Dispense Refill  . [name];[name] Inhale 2 puffs into the lungs as needed. Shortness of breathe    . FLUoxetine (PROZAC) 20 MG capsule Take 60 mg by mouth daily.    . fluticasone (FLONASE) 50 MCG/ACT nasal spray Place 2 sprays into both nostrils daily.    . fluticasone furoate-vilanterol (BREO ELLIPTA) 100-25 MCG/INH AEPB Inhale 1 puff into the lungs daily. 1 each 0  . olmesartan-hydrochlorothiazide (BENICAR HCT) 20-12.5 MG tablet Take 1 tablet by mouth daily.    Marland Kitchen [name] Take 2.5 mg by nebulization as needed. wheezing    . loratadine (CLARITIN) 10 MG tablet Take 10 mg by mouth daily.    Marland Kitchen omeprazole (PRILOSEC) 40 MG capsule Take 40 mg by mouth daily.    Jearl Klinefelter ELLIPTA 62.5-25 MCG/INH AEPB INHALE 1 PUFF BY MOUTH EVERY DAY 60 each 5   No facility-administered medications prior to visit.     Review of Systems  Constitutional: Negative for chills and fever.  HENT: Positive for congestion.        Postnasal drip  Eyes:       Dry eyes resolved  Respiratory: Positive for shortness of breath. Negative  for cough, sputum production and wheezing.   Cardiovascular: Negative for chest pain and leg swelling.  Gastrointestinal:       Occasional central abdominal pain, no n/v  Musculoskeletal: Negative.   Skin: Negative.   Neurological: Negative.      Objective:   Vitals:   07/14/19 1428  BP: (!) 142/70  Pulse: 88  Temp: 97.8 F (36.6 C)  TempSrc: Temporal  SpO2: 96%  Weight: 143 lb 12.8 oz (65.2 kg)  Height: 5\' 5"  (1.651 m)   96% on   RA BMI Readings from Last 3 Encounters:  07/14/19 23.93 kg/m  06/29/19 21.96 kg/m  01/27/18 24.25 kg/m   Wt Readings from Last 3 Encounters:  07/14/19 143 lb 12.8 oz (65.2 kg)  06/29/19 134 lb (60.8 kg)  01/27/18 148 lb (67.1 kg)    Physical Exam Vitals signs reviewed.  Constitutional:      Appearance: Normal appearance. She is not ill-appearing.  HENT:     Head: Normocephalic and atraumatic.     Nose:     Comments: Deferred due to masking requirement.    Mouth/Throat:     Comments: Deferred due to masking requirement. Eyes:     General: No scleral icterus. Neck:     Musculoskeletal: Neck supple.  Cardiovascular:     Rate and Rhythm: Normal rate and regular rhythm.     Heart sounds: No murmur.  Pulmonary:     Comments: Breathing comfortably on room air, no conversational dyspnea.  Clear to auscultation bilaterally. Abdominal:     General: There is no distension.     Palpations: Abdomen is soft.  Musculoskeletal:        General: No swelling or deformity.  Lymphadenopathy:     Cervical: No cervical adenopathy.  Skin:    General: Skin is warm and dry.     Findings: No rash.  Neurological:     General: No focal deficit present.     Mental Status: She is alert.     Coordination: Coordination normal.  Psychiatric:        Mood and Affect: Mood normal.        Behavior: Behavior normal.      Chest Imaging- films reviewed: CTA chest 01/13/2015-centrilobular emphysema, airway thickening, linear scar in RML.  Nodule in  lateral RML.  Linear scars in bilateral lower lobes.  Posterior diaphragmatic eventration on the left.  No significant mediastinal or hilar adenopathy.  Pulmonary Functions Testing Results: PFT Results Latest Ref Rng & Units 02/16/2015  FVC-Pre L 1.70  FVC-Predicted Pre % 72  FVC-Post L 1.66  FVC-Predicted Post % 70  Pre FEV1/FVC % % 51  Post FEV1/FCV % % 52  FEV1-Pre L 0.86  FEV1-Predicted Pre % 47  FEV1-Post L 0.87  DLCO UNC% % 37  DLCO COR %Predicted % 65  TLC L 4.70  TLC % Predicted % 95  RV % Predicted % 143   Moderate obstruction, severe diffusion impairment   Echocardiogram 01/05/2015: LVEF 55 to 66%, diastolic dysfunction.  Moderate TR.  Normal LA, RV.  Elevated PASP.  Normal mitral and aortic valves.    Heart Catheterization:  RHC 02/17/2015: RHC (6/16) with mean RA 6, PA 42/24 mean 32, mean PCWP 16, CI 2.69, PVR 3.4 WU. 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 Tobacco abuse  Z72.0   2. Other emphysema ([place])  J43.8   3. Pulmonary nodule, right  R91.1     COPD, GOLD C -Restart Anoro, hold Breo.  If her eye symptoms return, switch back to using Breo.  She already has several Anoro inhalers at home. -Continue albuterol as needed -Discussed the importance of smoking cessation and provided guidance on methods to quit. -Up-to-date on flu and  pneumonia vaccines -Continue Covid precautions-social distancing, mask wearing, handwashing  Tobacco abuse -Spent an extensive amount of time counseling her on the benefits of quitting or further cutting back.  I would recommend a trial of Chantix.  We discussed the uncommon side effects that would warrant stopping immediately-nightmares, depression, suicidal ideations.  Starter pack and maintenance prescribed.  Pulmonary nodule in 2016 -Discussed if she would want follow-up for this, which has not become clinically relevant  over the last 4-1/2 years, suggesting that it would be more likely to be benign.  We will revisit this in 3 months as she is unsure if she would want to pursue imaging that may demonstrate other incidental findings.   RTC in 3 months.   Current Outpatient Medications:  .  [name];[name], Inhale 2 puffs into the lungs as needed. Shortness of breathe, Disp: , Rfl:  .  FLUoxetine (PROZAC) 20 MG capsule, Take 60 mg by mouth daily., Disp: , Rfl:  .  fluticasone (FLONASE) 50 MCG/ACT nasal spray, Place 2 sprays into both nostrils daily., Disp: , Rfl:  .  fluticasone furoate-vilanterol (BREO ELLIPTA) 100-25 MCG/INH AEPB, Inhale 1 puff into the lungs daily., Disp: 1 each, Rfl: 0 .  olmesartan-hydrochlorothiazide (BENICAR HCT) 20-12.5 MG tablet, Take 1 tablet by mouth daily., Disp: , Rfl:  .  [name], Take 2.5 mg by nebulization as needed. wheezing, Disp: , Rfl:  .  loratadine (CLARITIN) 10 MG tablet, Take 10 mg by mouth daily., Disp: , Rfl:  .  omeprazole (PRILOSEC) 40 MG capsule, Take 40 mg by mouth daily., Disp: , Rfl:  .  varenicline (CHANTIX CONTINUING MONTH PAK) 1 MG tablet, Take 1 tablet (1 mg total) by mouth 2 (two) times daily., Disp: 60 tablet, Rfl: 4 .  varenicline (CHANTIX) 0.5 MG tablet, Take 1 tablet (0.5 mg total) by mouth 2 (two) times daily., Disp: 60 tablet, Rfl: 0   Julian Hy, DO [place] [facility] 07/14/2019 3:50 PM

## 2019-07-14 NOTE — Patient Instructions (Addendum)
Thank you for visiting Dr. Carlis Abbott at [facility]. We recommend the following:  Try switching back to Anoro once daily (stop Breo). If eye symptoms return, switch back to [facility] inhaler once daily.  Start claritin again. Keep using flonase once daily.  Meds ordered this encounter  Medications  . varenicline (CHANTIX) 0.5 MG tablet    Sig: Take 1 tablet (0.5 mg total) by mouth 2 (two) times daily.    Dispense:  60 tablet    Refill:  0  . varenicline (CHANTIX CONTINUING MONTH PAK) 1 MG tablet    Sig: Take 1 tablet (1 mg total) by mouth 2 (two) times daily.    Dispense:  60 tablet    Refill:  4    Return in about 3 months (around 10/14/2019).    Please do your part to reduce the spread of COVID-19.   It is very important that you stop smoking or vaping. This is the single most important thing that you can do to improve your lung health.   S = Set a quit date. T = Tell family, friends, and the people around you that you plan to quit. A = Anticipate or plan ahead for the tough times you'll face while quitting. R = Remove cigarettes and other tobacco products from your home, car, and work. T = Talk to [place] about getting help to quit.  If you need help, please reach out to our office or the smoking cessation resources available: [place] Smoking Cessation Class: [phone] 1-800-QUIT-NOW www.BeTobaccoFree.gov

## 2019-09-22 ENCOUNTER — Encounter: Payer: Self-pay | Admitting: Critical Care Medicine

## 2019-09-22 ENCOUNTER — Ambulatory Visit: Payer: Medicare Other | Admitting: Critical Care Medicine

## 2019-09-22 ENCOUNTER — Other Ambulatory Visit: Payer: Self-pay

## 2019-09-22 ENCOUNTER — Ambulatory Visit (INDEPENDENT_AMBULATORY_CARE_PROVIDER_SITE_OTHER): Payer: Medicare Other | Admitting: Critical Care Medicine

## 2019-09-22 VITALS — BP 150/80 | HR 87 | Ht 65.0 in | Wt 143.8 lb

## 2019-09-22 DIAGNOSIS — R911 Solitary pulmonary nodule: Secondary | ICD-10-CM | POA: Diagnosis not present

## 2019-09-22 DIAGNOSIS — Z720 Tobacco use: Secondary | ICD-10-CM

## 2019-09-22 DIAGNOSIS — J438 Other emphysema: Secondary | ICD-10-CM | POA: Diagnosis not present

## 2019-09-22 MED ORDER — ALBUTEROL SULFATE HFA 108 (90 BASE) MCG/ACT IN AERS: 2.0000 | INHALATION_SPRAY | RESPIRATORY_TRACT | 5 refills | Status: DC | PRN

## 2019-09-22 MED ORDER — ANORO ELLIPTA 62.5-25 MCG/INH IN AEPB: 1.0000 | INHALATION_SPRAY | Freq: Every day | RESPIRATORY_TRACT | 11 refills | Status: DC

## 2019-09-22 NOTE — Patient Instructions (Addendum)
Thank you for visiting Dr. Carlis Abbott at [facility]. We recommend the following: Orders Placed This Encounter  Procedures  . CT Chest Wo Contrast   Orders Placed This Encounter  Procedures  . CT Chest Wo Contrast    Standing Status:   Future    Standing Expiration Date:   11/19/2020    Order Specific Question:   ** REASON FOR EXAM (FREE TEXT)    Answer:   smoker, pulm nodule follow up from 2016    Order Specific Question:   Preferred imaging location?    Answer:   [facility]    Order Specific Question:   Radiology Contrast Protocol - do NOT remove file path    Answer:   [email or web address]    Meds ordered this encounter  Medications  . umeclidinium-vilanterol (ANORO ELLIPTA) 62.5-25 MCG/INH AEPB    Sig: Inhale 1 puff into the lungs daily.    Dispense:  1 each    Refill:  11  . albuterol ([name]    Sig: Inhale 2 puffs into the lungs every 4 (four) hours as needed for wheezing or shortness of breath.    Dispense:  18 g    Refill:  5     Start taking eye drops every day as prescribed. Follow up [facility] as prescribed. Start taking Claritin every day.   I agree with trying the nicorette gum. Keep working on quitting!   Return in about 3 months (around 12/20/2019).    Please do your part to reduce the spread of COVID-19.

## 2019-09-22 NOTE — Progress Notes (Signed)
Synopsis: Referred in August 2016 for emphysema by Katrina Bill, MD.  Previously patient of Dr. Lake Ball.  Subjective:   PATIENT ID: Katrina Ball GENDER: female DOB: 08/08/1946, MRN: [id number]  Chief Complaint  Patient presents with  . Follow-up    Patient is here for follow up. Patient was restarted on Anoro and Claritin. Patient doesn't notice a difference with Claritin but did take Zyrtec yesterday and noticed a little difference.    Katrina Ball is a 74 year old woman who presents for follow-up of COPD.  She has been taking her Anoro daily, and continues to have issues with her eyes.  She has not been using her eyedrops regularly as prescribed.  Notes from [facility] ([facility]) from 07/01/2019 reviewed-dry eyes with punctate keratitis, mild cataracts, lid lag, recommended to take gel eyedrops.  She has ongoing sinus pressure and congestion, but has only been using Claritin intermittently.  She has improvement in her symptoms when she takes it, causing her to stop taking it.  Shortness of breath- varies day-to-day.  Some days symptoms are great, some days she has trouble walking around the house.  Does not seem to correlate with when she has been taking Claritin.  She uses her albuterol about twice daily, with improvement of her symptoms.  She has cut down to 3 to 4 cigarettes/day, mostly because she does not work outside and smoking cold weather.  She wants to try nicotine gum.  Insurance did not cover Chantix.  She is interested in getting Covid vaccine.     OV 07/14/2019: Ms. Katrina Ball is a 74 year old woman who presents for follow-up of COPD.  She recently had exacerbation associated with sinusitis that resolved with ampicillin-her green mucus and sinus congestion have improved.  Due to eye symptoms, she was recently switched from Anoro to [place], but she does not feel that her breathing is as well controlled on Breo.  She describes her eyes as having been dry.  She saw [facility] last week and has a follow-up appointment in 6 months.  They did not recommend that she remain off of a LAMA.  She continues to have postnasal drip, and has been using Flonase, but has not been taking Claritin.  Her COPD symptoms are overall well controlled, she denies cough, sputum, and only has occasional wheezing.  She has dyspnea on exertion that limits her activity to walking about 1 block.  She continues to smoke 3 to 4 cigarettes/day, some days more if she is having a bad day.  Her heavy smoking was 6 cigarettes/day x 35 years.  She is thinking about quitting but does not think she is quite ready.  She has never been hospitalized for COPD.    Past Medical History:  Diagnosis Date  . Anemia    "used to be" (12/23/2012)  . Anxiety   . Arrhythmia   . Arthritis   . Chest pain   . COPD (chronic obstructive pulmonary disease) ([place])   . Depression   . Exertional shortness of breath   . Fracture of tibial plateau, closed 12/23/2012  . GERD (gastroesophageal reflux disease)   . Heart murmur   . Hypertension   . Numbness and tingling in hands   . Sleep apnea      Family History  Problem Relation Age of Onset  . CVA Mother   . Heart failure Mother   . Heart disease Mother   . Asthma Mother      Past Surgical  History:  Procedure Laterality Date  . CARDIAC CATHETERIZATION N/A 02/17/2015   Procedure: Right Heart Cath;  Surgeon: Larey Dresser, MD;  Location: [facility];  Service: Cardiovascular;  Laterality: N/A;  . INCISION AND DRAINAGE OF WOUND  12/07/2012   "LLE" (12/23/2012)  . ORIF PROXIMAL TIBIAL PLATEAU FRACTURE Left 12/23/2012  . ORIF TIBIA PLATEAU Left 12/23/2012   Procedure: OPEN REDUCTION INTERNAL FIXATION (ORIF) LEFT TIBIAL PLATEAU;  Surgeon: Johnny Bridge, MD;  Location: [facility];  Service: Orthopedics;  Laterality: Left;  . TUBAL LIGATION      Social History   Socioeconomic History  . Marital status: Widowed    Spouse name: Not on file  . Number of children:  3  . Years of education: Not on file  . Highest education level: Not on file  Occupational History  . Occupation: retired  Tobacco Use  . Smoking status: Current Every Day Smoker    Packs/day: 0.25    Years: 45.00    Pack years: 11.25    Types: Cigarettes  . Smokeless tobacco: Never Used  . Tobacco comment: 3-4 cigarettes per day 06/29/19  Substance and Sexual Activity  . Alcohol use: Yes    Alcohol/week: 9.0 standard drinks    Types: 3 Cans of beer, 6 Shots of liquor per week    Comment: 12/23/2012 "alcohol intake varys greatly"  . Drug use: No  . Sexual activity: Never  Other Topics Concern  . Not on file  Social History Narrative  . Not on file   Social Determinants of Health   Financial Resource Strain:   . Difficulty of Paying Living Expenses: Not on file  Food Insecurity:   . Worried About Charity fundraiser in the Last Year: Not on file  . Ran Out of Food in the Last Year: Not on file  Transportation Needs:   . Lack of Transportation (Medical): Not on file  . Lack of Transportation (Non-Medical): Not on file  Physical Activity:   . Days of Exercise per Week: Not on file  . Minutes of Exercise per Session: Not on file  Stress:   . Feeling of Stress : Not on file  Social Connections:   . Frequency of Communication with Friends and Family: Not on file  . Frequency of Social Gatherings with Friends and Family: Not on file  . Attends Religious Services: Not on file  . Active Member of Clubs or Organizations: Not on file  . Attends Archivist Meetings: Not on file  . Marital Status: Not on file  Intimate Partner Violence:   . Fear of Current or Ex-Partner: Not on file  . Emotionally Abused: Not on file  . Physically Abused: Not on file  . Sexually Abused: Not on file     No Known Allergies   Immunization History  Administered Date(s) Administered  . Influenza Split 05/29/2016  . Influenza, High Dose Seasonal PF 05/08/2019  . Influenza,inj,Quad  PF,6+ Mos 05/31/2015  . Influenza-Unspecified 05/29/2016  . Pneumococcal Conjugate-13 12/15/2015  . Pneumococcal Polysaccharide-23 10/05/2013    Outpatient Medications Prior to Visit  Medication Sig Dispense Refill  . FLUoxetine (PROZAC) 20 MG capsule Take 60 mg by mouth daily.    . fluticasone (FLONASE) 50 MCG/ACT nasal spray Place 2 sprays into both nostrils daily.    . fluticasone furoate-vilanterol (BREO ELLIPTA) 100-25 MCG/INH AEPB Inhale 1 puff into the lungs daily. 1 each 0  . loratadine (CLARITIN) 10 MG tablet Take 10 mg  by mouth daily.    Marland Kitchen olmesartan-hydrochlorothiazide (BENICAR HCT) 20-12.5 MG tablet Take 1 tablet by mouth daily.    Marland Kitchen omeprazole (PRILOSEC) 40 MG capsule Take 40 mg by mouth daily.    Marland Kitchen [name];[name] Inhale 2 puffs into the lungs as needed. Shortness of breathe    . [name] Take 2.5 mg by nebulization as needed. wheezing    . varenicline (CHANTIX CONTINUING MONTH PAK) 1 MG tablet Take 1 tablet (1 mg total) by mouth 2 (two) times daily. (Patient not taking: Reported on 09/22/2019) 60 tablet 4  . varenicline (CHANTIX) 0.5 MG tablet Take 1 tablet (0.5 mg total) by mouth 2 (two) times daily. 60 tablet 0   No facility-administered medications prior to visit.    Review of Systems  Constitutional: Negative for chills and fever.  HENT: Positive for congestion.        Postnasal drip  Eyes:       Dry eyes resolved  Respiratory: Positive for shortness of breath. Negative for cough, sputum production and wheezing.   Cardiovascular: Negative for chest pain and leg swelling.  Gastrointestinal:       Occasional central abdominal pain, no n/v  Musculoskeletal: Negative.   Skin: Negative.   Neurological: Negative.      Objective:   Vitals:   09/22/19 1021  BP: (!) 150/80  Pulse: 87  SpO2: 95%  Weight: 143 lb 12.8 oz (65.2 kg)  Height: 5\' 5"  (1.651 m)   95%  on   RA BMI Readings from Last 3 Encounters:  09/22/19 23.93 kg/m  07/14/19 23.93 kg/m  06/29/19 21.96 kg/m   Wt Readings from Last 3 Encounters:  09/22/19 143 lb 12.8 oz (65.2 kg)  07/14/19 143 lb 12.8 oz (65.2 kg)  06/29/19 134 lb (60.8 kg)    Physical Exam Vitals reviewed.  Constitutional:      General: She is not in acute distress.    Appearance: She is not ill-appearing.  HENT:     Head: Normocephalic and atraumatic.     Nose:     Comments: Deferred due to masking requirement.    Mouth/Throat:     Comments: Deferred due to masking requirement. Eyes:     General: No scleral icterus. Cardiovascular:     Rate and Rhythm: Normal rate and regular rhythm.  Pulmonary:     Comments: Breathing comfortably on room air, no conversational dyspnea.  Clear to auscultation bilaterally. Abdominal:     General: There is no distension.     Tenderness: There is no abdominal tenderness.  Musculoskeletal:        General: No swelling or deformity.     Cervical back: Neck supple.  Lymphadenopathy:     Cervical: No cervical adenopathy.  Skin:    General: Skin is warm and dry.     Findings: No rash.  Neurological:     General: No focal deficit present.     Mental Status: She is alert.     Coordination: Coordination normal.  Psychiatric:        Mood and Affect: Mood normal.        Behavior: Behavior normal.      Chest Imaging- films reviewed: CTA chest 01/13/2015-centrilobular emphysema, airway thickening, linear scar in RML.  Nodule in lateral RML.  Linear scars in bilateral lower lobes.  Posterior diaphragmatic eventration on the left.  No significant mediastinal or hilar adenopathy.  Pulmonary Functions Testing  Results: PFT Results Latest Ref Rng & Units 02/16/2015  FVC-Pre L 1.70  FVC-Predicted Pre % 72  FVC-Post L 1.66  FVC-Predicted Post % 70  Pre FEV1/FVC % % 51  Post FEV1/FCV % % 52  FEV1-Pre L 0.86  FEV1-Predicted Pre % 47  FEV1-Post L 0.87  DLCO UNC% % 37  DLCO  COR %Predicted % 65  TLC L 4.70  TLC % Predicted % 95  RV % Predicted % 143   Moderate obstruction, severe diffusion impairment   Echocardiogram 01/05/2015: LVEF 55 to 47%, diastolic dysfunction.  Moderate TR.  Normal LA, RV.  Elevated PASP.  Normal mitral and aortic valves.    Heart Catheterization:  RHC 02/17/2015: RHC (6/16) with mean RA 6, PA 42/24 mean 32, mean PCWP 16, CI 2.69, PVR 3.4 WU. 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 Pulmonary nodule  R91.1 CT Chest Wo Contrast    umeclidinium-vilanterol (ANORO ELLIPTA) 62.5-25 MCG/INH AEPB    albuterol ([name]  2. Other emphysema ([place])  J43.8   3. Tobacco abuse  Z72.0      COPD, GOLD C -Continue Anoro once daily.   -Resume eye drops as prescribed by ophthalmology.  She should follow-up with them as prescribed -Continue albuterol as needed -Strongly recommend smoking cessation entirely. -Up-to-date on seasonal flu and pneumonia vaccines.  Recommend Covid vaccine when it is available.  She was registered for the  vaccine waitlist today in clinic. -Continue Covid precautions-social distancing, mask wearing, handwashing.  Tobacco abuse; working on cutting down [employer] her on her quitting efforts and encouraged her to fully quit.  I agree with her plan to use Nicorette gum.  Unfortunately Chantix is not covered by her insurance.  Allergic rhinitis -Restart Claritin daily  Pulmonary nodule in 2016 -Agreed today for follow-up CT scan.  Discussed the risks and benefits of scanning, including the potential for findings that may lead to additional scans or procedures.  She will need lung cancer screening annually.   RTC in 3 months.   Current Outpatient Medications:  .  FLUoxetine (PROZAC) 20 MG capsule, Take 60 mg by mouth daily., Disp: , Rfl:  .  fluticasone  (FLONASE) 50 MCG/ACT nasal spray, Place 2 sprays into both nostrils daily., Disp: , Rfl:  .  fluticasone furoate-vilanterol (BREO ELLIPTA) 100-25 MCG/INH AEPB, Inhale 1 puff into the lungs daily., Disp: 1 each, Rfl: 0 .  loratadine (CLARITIN) 10 MG tablet, Take 10 mg by mouth daily., Disp: , Rfl:  .  olmesartan-hydrochlorothiazide (BENICAR HCT) 20-12.5 MG tablet, Take 1 tablet by mouth daily., Disp: , Rfl:  .  omeprazole (PRILOSEC) 40 MG capsule, Take 40 mg by mouth daily., Disp: , Rfl:  .  albuterol ([name], Inhale 2 puffs into the lungs every 4 (four) hours as needed for wheezing or shortness of breath., Disp: 18 g, Rfl: 5 .  umeclidinium-vilanterol (ANORO ELLIPTA) 62.5-25 MCG/INH AEPB, Inhale 1 puff into the lungs daily., Disp: 1 each, Rfl: 11   Julian Hy, DO [facility] 09/22/2019 1:28 PM

## 2019-09-22 NOTE — Progress Notes (Deleted)
Synopsis: Referred in August 2016 for emphysema by Bartholome Bill, MD.  Previously patient of Dr. Lake Bells.  Subjective:   PATIENT ID: Katrina Ball GENDER: female DOB: December 04, 1945, MRN: [id number]  No chief complaint on file.   HPI  COPD- Issues with restarting anoro?  Tobacco -Chantix?  Does she want to follow-up 2016 pulmonary nodule?  [facility] [place], [place]) note 07/01/2019 reviewed-dry eye syndrome and punctate keratitis-treated with gel drops, mild cataracts, lid lag-may require oculoplastics intervention if condition worsens   OV 07/14/2019: Katrina Ball is a 74 year old woman who presents for follow-up of COPD.  She recently had exacerbation associated with sinusitis that resolved with ampicillin-her green mucus and sinus congestion have improved.  Due to eye symptoms, she was recently switched from Anoro to [place], but she does not feel that her breathing is as well controlled on Breo.  She describes her eyes as having been dry.  She saw [facility] last week and has a follow-up appointment in 6 months.  They did not recommend that she remain off of a LAMA.  She continues to have postnasal drip, and has been using Flonase, but has not been taking Claritin.  Her COPD symptoms are overall well controlled, she denies cough, sputum, and only has occasional wheezing.  She has dyspnea on exertion that limits her activity to walking about 1 block.  She continues to smoke 3 to 4 cigarettes/day, some days more if she is having a bad day.  Her heavy smoking was 6 cigarettes/day x 35 years.  She is thinking about quitting but does not think she is quite ready.  She has never been hospitalized for COPD.    Past Medical History:  Diagnosis Date  . Anemia    "used to be" (12/23/2012)  . Anxiety   . Arrhythmia   . Arthritis   . Chest pain   . COPD (chronic obstructive pulmonary disease) ([place])   . Depression   . Exertional shortness of breath   . Fracture of tibial plateau,  closed 12/23/2012  . GERD (gastroesophageal reflux disease)   . Heart murmur   . Hypertension   . Numbness and tingling in hands   . Sleep apnea      Family History  Problem Relation Age of Onset  . CVA Mother   . Heart failure Mother   . Heart disease Mother   . Asthma Mother      Past Surgical History:  Procedure Laterality Date  . CARDIAC CATHETERIZATION N/A 02/17/2015   Procedure: Right Heart Cath;  Surgeon: Larey Dresser, MD;  Location: [facility];  Service: Cardiovascular;  Laterality: N/A;  . INCISION AND DRAINAGE OF WOUND  12/07/2012   "LLE" (12/23/2012)  . ORIF PROXIMAL TIBIAL PLATEAU FRACTURE Left 12/23/2012  . ORIF TIBIA PLATEAU Left 12/23/2012   Procedure: OPEN REDUCTION INTERNAL FIXATION (ORIF) LEFT TIBIAL PLATEAU;  Surgeon: Johnny Bridge, MD;  Location: [facility];  Service: Orthopedics;  Laterality: Left;  . TUBAL LIGATION      Social History   Socioeconomic History  . Marital status: Widowed    Spouse name: Not on file  . Number of children: 3  . Years of education: Not on file  . Highest education level: Not on file  Occupational History  . Occupation: retired  Tobacco Use  . Smoking status: Current Every Day Smoker    Packs/day: 0.25    Years: 45.00    Pack years: 11.25  Types: Cigarettes  . Smokeless tobacco: Never Used  . Tobacco comment: 3-4 cigarettes per day 06/29/19  Substance and Sexual Activity  . Alcohol use: Yes    Alcohol/week: 9.0 standard drinks    Types: 3 Cans of beer, 6 Shots of liquor per week    Comment: 12/23/2012 "alcohol intake varys greatly"  . Drug use: No  . Sexual activity: Never  Other Topics Concern  . Not on file  Social History Narrative  . Not on file   Social Determinants of Health   Financial Resource Strain:   . Difficulty of Paying Living Expenses: Not on file  Food Insecurity:   . Worried About Charity fundraiser in the Last Year: Not on file  . Ran Out of Food in the Last Year: Not on file    Transportation Needs:   . Lack of Transportation (Medical): Not on file  . Lack of Transportation (Non-Medical): Not on file  Physical Activity:   . Days of Exercise per Week: Not on file  . Minutes of Exercise per Session: Not on file  Stress:   . Feeling of Stress : Not on file  Social Connections:   . Frequency of Communication with Friends and Family: Not on file  . Frequency of Social Gatherings with Friends and Family: Not on file  . Attends Religious Services: Not on file  . Active Member of Clubs or Organizations: Not on file  . Attends Archivist Meetings: Not on file  . Marital Status: Not on file  Intimate Partner Violence:   . Fear of Current or Ex-Partner: Not on file  . Emotionally Abused: Not on file  . Physically Abused: Not on file  . Sexually Abused: Not on file     No Known Allergies   Immunization History  Administered Date(s) Administered  . Influenza Split 05/29/2016  . Influenza, High Dose Seasonal PF 05/08/2019  . Influenza,inj,Quad PF,6+ Mos 05/31/2015  . Influenza-Unspecified 05/29/2016  . Pneumococcal Conjugate-13 12/15/2015  . Pneumococcal Polysaccharide-23 10/05/2013    Outpatient Medications Prior to Visit  Medication Sig Dispense Refill  . [name];[name] Inhale 2 puffs into the lungs as needed. Shortness of breathe    . [name] Take 2.5 mg by nebulization as needed. wheezing    . FLUoxetine (PROZAC) 20 MG capsule Take 60 mg by mouth daily.    . fluticasone (FLONASE) 50 MCG/ACT nasal spray Place 2 sprays into both nostrils daily.    . fluticasone furoate-vilanterol (BREO ELLIPTA) 100-25 MCG/INH AEPB Inhale 1 puff into the lungs daily. 1 each 0  . loratadine (CLARITIN) 10 MG tablet Take 10 mg by mouth daily.    Marland Kitchen olmesartan-hydrochlorothiazide (BENICAR HCT) 20-12.5 MG tablet Take 1 tablet by mouth daily.    Marland Kitchen omeprazole  (PRILOSEC) 40 MG capsule Take 40 mg by mouth daily.    . varenicline (CHANTIX CONTINUING MONTH PAK) 1 MG tablet Take 1 tablet (1 mg total) by mouth 2 (two) times daily. 60 tablet 4  . varenicline (CHANTIX) 0.5 MG tablet Take 1 tablet (0.5 mg total) by mouth 2 (two) times daily. 60 tablet 0   No facility-administered medications prior to visit.    Review of Systems  Constitutional: Negative for chills and fever.  HENT: Positive for congestion.        Postnasal drip  Eyes:       Dry eyes resolved  Respiratory: Positive for shortness  of breath. Negative for cough, sputum production and wheezing.   Cardiovascular: Negative for chest pain and leg swelling.  Gastrointestinal:       Occasional central abdominal pain, no n/v  Musculoskeletal: Negative.   Skin: Negative.   Neurological: Negative.      Objective:   There were no vitals filed for this visit.   on   RA BMI Readings from Last 3 Encounters:  07/14/19 23.93 kg/m  06/29/19 21.96 kg/m  01/27/18 24.25 kg/m   Wt Readings from Last 3 Encounters:  07/14/19 143 lb 12.8 oz (65.2 kg)  06/29/19 134 lb (60.8 kg)  01/27/18 148 lb (67.1 kg)    Physical Exam Vitals reviewed.  Constitutional:      Appearance: Normal appearance. She is not ill-appearing.  HENT:     Head: Normocephalic and atraumatic.     Nose:     Comments: Deferred due to masking requirement.    Mouth/Throat:     Comments: Deferred due to masking requirement. Eyes:     General: No scleral icterus. Cardiovascular:     Rate and Rhythm: Normal rate and regular rhythm.     Heart sounds: No murmur.  Pulmonary:     Comments: Breathing comfortably on room air, no conversational dyspnea.  Clear to auscultation bilaterally. Abdominal:     General: There is no distension.     Palpations: Abdomen is soft.  Musculoskeletal:        General: No swelling or deformity.     Cervical back: Neck supple.  Lymphadenopathy:     Cervical: No cervical adenopathy.   Skin:    General: Skin is warm and dry.     Findings: No rash.  Neurological:     General: No focal deficit present.     Mental Status: She is alert.     Coordination: Coordination normal.  Psychiatric:        Mood and Affect: Mood normal.        Behavior: Behavior normal.      Chest Imaging- films reviewed: CTA chest 01/13/2015-centrilobular emphysema, airway thickening, linear scar in RML.  Nodule in lateral RML.  Linear scars in bilateral lower lobes.  Posterior diaphragmatic eventration on the left.  No significant mediastinal or hilar adenopathy.  Pulmonary Functions Testing Results: PFT Results Latest Ref Rng & Units 02/16/2015  FVC-Pre L 1.70  FVC-Predicted Pre % 72  FVC-Post L 1.66  FVC-Predicted Post % 70  Pre FEV1/FVC % % 51  Post FEV1/FCV % % 52  FEV1-Pre L 0.86  FEV1-Predicted Pre % 47  FEV1-Post L 0.87  DLCO UNC% % 37  DLCO COR %Predicted % 65  TLC L 4.70  TLC % Predicted % 95  RV % Predicted % 143   Moderate obstruction, severe diffusion impairment   Echocardiogram 01/05/2015: LVEF 55 to 67%, diastolic dysfunction.  Moderate TR.  Normal LA, RV.  Elevated PASP.  Normal mitral and aortic valves.    Heart Catheterization:  RHC 02/17/2015: RHC (6/16) with mean RA 6, PA 42/24 mean 32, mean PCWP 16, CI 2.69, PVR 3.4 WU. 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  COPD, GOLD C -Restart Anoro, hold Breo.  If her eye symptoms return, switch back to using Breo.  She already has several Anoro inhalers at home. -Continue albuterol as needed -Discussed the importance of smoking cessation and provided guidance on methods  to quit. -Up-to-date on flu and pneumonia vaccines -Continue Covid precautions-social distancing, mask wearing, handwashing  Tobacco abuse -Spent an extensive amount of time counseling her on the benefits of quitting or further cutting  back.  I would recommend a trial of Chantix.  We discussed the uncommon side effects that would warrant stopping immediately-nightmares, depression, suicidal ideations.  Starter pack and maintenance prescribed.  Pulmonary nodule in 2016 -Discussed if she would want follow-up for this, which has not become clinically relevant over the last 4-1/2 years, suggesting that it would be more likely to be benign.  We will revisit this in 3 months as she is unsure if she would want to pursue imaging that may demonstrate other incidental findings.   RTC in 3 months.   Current Outpatient Medications:  .  [name];[name], Inhale 2 puffs into the lungs as needed. Shortness of breathe, Disp: , Rfl:  .  [name], Take 2.5 mg by nebulization as needed. wheezing, Disp: , Rfl:  .  FLUoxetine (PROZAC) 20 MG capsule, Take 60 mg by mouth daily., Disp: , Rfl:  .  fluticasone (FLONASE) 50 MCG/ACT nasal spray, Place 2 sprays into both nostrils daily., Disp: , Rfl:  .  fluticasone furoate-vilanterol (BREO ELLIPTA) 100-25 MCG/INH AEPB, Inhale 1 puff into the lungs daily., Disp: 1 each, Rfl: 0 .  loratadine (CLARITIN) 10 MG tablet, Take 10 mg by mouth daily., Disp: , Rfl:  .  olmesartan-hydrochlorothiazide (BENICAR HCT) 20-12.5 MG tablet, Take 1 tablet by mouth daily., Disp: , Rfl:  .  omeprazole (PRILOSEC) 40 MG capsule, Take 40 mg by mouth daily., Disp: , Rfl:  .  varenicline (CHANTIX CONTINUING MONTH PAK) 1 MG tablet, Take 1 tablet (1 mg total) by mouth 2 (two) times daily., Disp: 60 tablet, Rfl: 4 .  varenicline (CHANTIX) 0.5 MG tablet, Take 1 tablet (0.5 mg total) by mouth 2 (two) times daily., Disp: 60 tablet, Rfl: 0   Julian Hy, DO  [facility] 09/22/2019 7:31 AM

## 2019-10-15 ENCOUNTER — Ambulatory Visit: Payer: Medicare Other | Attending: Internal Medicine

## 2019-10-15 DIAGNOSIS — Z23 Encounter for immunization: Secondary | ICD-10-CM | POA: Insufficient documentation

## 2019-10-15 NOTE — Progress Notes (Signed)
   Covid-19 Vaccination Clinic  Name:  Katrina Ball    MRN: [id number] DOB: [birth year]  10/15/2019  [name] was observed post Covid-19 immunization for 15 minutes without incidence. She was provided with Vaccine Information Sheet and instruction to access the V-Safe system.   [name] was instructed to call 911 with any severe reactions post vaccine: Marland Kitchen Difficulty breathing  . Swelling of your face and throat  . A fast heartbeat  . A bad rash all over your body  . Dizziness and weakness    Immunizations Administered    Name Date Dose VIS Date Route   Pfizer COVID-19 Vaccine 10/15/2019 12:27 PM 0.3 mL 07/31/2019 Intramuscular   Manufacturer: [place]   Lot: [id number]   [place]: [id number]

## 2019-11-10 ENCOUNTER — Ambulatory Visit: Payer: Medicare Other | Attending: Internal Medicine

## 2019-11-10 DIAGNOSIS — Z23 Encounter for immunization: Secondary | ICD-10-CM

## 2019-11-10 NOTE — Progress Notes (Signed)
   Covid-19 Vaccination Clinic  Name:  Katrina Ball    MRN: [id number] DOB: [birth year]  11/10/2019  Katrina Ball was observed post Covid-19 immunization for 15 minutes without incident. She was provided with Vaccine Information Sheet and instruction to access the V-Safe system.   Katrina Ball was instructed to call 911 with any severe reactions post vaccine: Marland Kitchen Difficulty breathing  . Swelling of face and throat  . A fast heartbeat  . A bad rash all over body  . Dizziness and weakness   Immunizations Administered    Name Date Dose VIS Date Route   Pfizer COVID-19 Vaccine 11/10/2019 11:55 AM 0.3 mL 07/31/2019 Intramuscular   Manufacturer: [place]   Lot: [id number]   [place]: [id number]

## 2019-12-21 ENCOUNTER — Inpatient Hospital Stay: Admission: RE | Admit: 2019-12-21 | Payer: Medicare Other | Source: Ambulatory Visit

## 2019-12-21 ENCOUNTER — Ambulatory Visit
Admission: RE | Admit: 2019-12-21 | Discharge: 2019-12-21 | Disposition: A | Discharge status: Home / Self Care | Payer: Medicare Other | Source: Ambulatory Visit | Attending: Critical Care Medicine | Admitting: Critical Care Medicine

## 2019-12-21 DIAGNOSIS — R911 Solitary pulmonary nodule: Secondary | ICD-10-CM

## 2019-12-22 ENCOUNTER — Telehealth: Payer: Self-pay

## 2019-12-22 DIAGNOSIS — J438 Other emphysema: Secondary | ICD-10-CM

## 2019-12-22 DIAGNOSIS — R0602 Shortness of breath: Secondary | ICD-10-CM

## 2019-12-22 DIAGNOSIS — Z01812 Encounter for preprocedural laboratory examination: Secondary | ICD-10-CM

## 2019-12-22 DIAGNOSIS — R911 Solitary pulmonary nodule: Secondary | ICD-10-CM

## 2019-12-22 DIAGNOSIS — Z720 Tobacco use: Secondary | ICD-10-CM

## 2019-12-22 NOTE — Telephone Encounter (Signed)
I attempted to call Ms. Rosemeyer today to discuss her CT results.  No answer, but left a message for her to call back.  Will try again later.  Julian Hy, DO 12/22/19 10:58 AM [facility]

## 2019-12-22 NOTE — Telephone Encounter (Signed)
I spoke to Mrs. Hoch regarding her CT results.  She is 1.5 cm right middle lobe mass concerning for primary lung cancer.  She understands of this would be a early stage and it is important to get a biopsy since that we can make a treatment plan.  I have asked Drs. Icard and Byrum to review the CT scan to determine if she is a good candidate for navigational bronchoscopy.  If not a transcutaneous biopsy may be the most reliable first diagnostic study. PET scan ordered.  Julian Hy, DO 12/22/19 3:43 PM [facility]

## 2019-12-22 NOTE — Telephone Encounter (Signed)
Called to discuss CT scan; left a message.  Julian Hy, DO 12/22/19 1:25 PM [facility]

## 2019-12-22 NOTE — Telephone Encounter (Signed)
Patient is returning Dr. Carlis Abbott phone call. Patient phone number is [phone].

## 2019-12-22 NOTE — Telephone Encounter (Signed)
Spoke with [facility] about a call report for 12/21/2019 CT chest.  Impression copied below, full report available in Epic.  Dr. Carlis Abbott please advise on recs.  Thanks!   IMPRESSION: 1. There is a new 1.5 cm peripheral pulmonary nodule in the right middle lobe. This is concerning for malignancy. Pulmonary medicine follow-up is recommended. 2. There is a 1.6 cm nodule within the isthmus of the thyroid gland. Recommend thyroid US.(Ref: J Am Coll Radiol. 2015 Feb;12(2): 143-50). 3. Coronary artery disease.  Aortic Atherosclerosis (ICD10-I70.0) and Emphysema (ICD10-J43.9).

## 2019-12-23 NOTE — Telephone Encounter (Signed)
Please schedule the following:  Diagnosis: lung nodule Procedure: bronch with navigation & EBUS with RB Anesthesia: yes Do you need Fluro? no Priority: yes Date: 5/11 Alternate Date: talk to Byrum  Time: either  Location: [facility] Does patient have OSA? no DM? no Or Latex allergy? no Medication Restriction: hold Benicar day of surgery Anticoagulate/Antiplatelet: none Pre-op Labs Ordered: CBC, CMP, PT/INR, PTT Imaging request: needs CT scan converted to superD (If, SuperDimension CT Chest, please have STAT courier sent to [facility] [address].)  Please coordinate [place], DO 12/23/19 6:52 PM [facility]

## 2019-12-24 NOTE — Telephone Encounter (Signed)
Orders for pre labs placed Will route to [facility] to get scheduled

## 2019-12-24 NOTE — Telephone Encounter (Signed)
I have scheduled this and spoke to pt and gave her appt info.

## 2019-12-25 ENCOUNTER — Telehealth: Payer: Self-pay | Admitting: Critical Care Medicine

## 2019-12-25 ENCOUNTER — Other Ambulatory Visit: Payer: Medicare Other

## 2019-12-25 DIAGNOSIS — Z01812 Encounter for preprocedural laboratory examination: Secondary | ICD-10-CM

## 2019-12-25 DIAGNOSIS — J438 Other emphysema: Secondary | ICD-10-CM

## 2019-12-25 DIAGNOSIS — Z720 Tobacco use: Secondary | ICD-10-CM

## 2019-12-25 DIAGNOSIS — R911 Solitary pulmonary nodule: Secondary | ICD-10-CM

## 2019-12-25 DIAGNOSIS — R0602 Shortness of breath: Secondary | ICD-10-CM

## 2019-12-25 LAB — CBC WITH DIFFERENTIAL/PLATELET
Basophils Absolute: 0.1 10*3/uL (ref 0.0–0.1)
Basophils Relative: 1 % (ref 0.0–3.0)
Eosinophils Absolute: 0.1 10*3/uL (ref 0.0–0.7)
Eosinophils Relative: 1.6 % (ref 0.0–5.0)
HCT: 37.6 % (ref 36.0–46.0)
Hemoglobin: 12.4 g/dL (ref 12.0–15.0)
Lymphocytes Relative: 11.7 % — ABNORMAL LOW (ref 12.0–46.0)
Lymphs Abs: 0.9 10*3/uL (ref 0.7–4.0)
MCHC: 32.9 g/dL (ref 30.0–36.0)
MCV: 92.8 fl (ref 78.0–100.0)
Monocytes Absolute: 0.7 10*3/uL (ref 0.1–1.0)
Monocytes Relative: 9.1 % (ref 3.0–12.0)
Neutro Abs: 5.9 10*3/uL (ref 1.4–7.7)
Neutrophils Relative %: 76.6 % (ref 43.0–77.0)
Platelets: 390 10*3/uL (ref 150.0–400.0)
RBC: 4.06 Mil/uL (ref 3.87–5.11)
RDW: 12.7 % (ref 11.5–15.5)
WBC: 7.7 10*3/uL (ref 4.0–10.5)

## 2019-12-25 LAB — COMPREHENSIVE METABOLIC PANEL
ALT: 10 U/L (ref 0–35)
AST: 15 U/L (ref 0–37)
Albumin: 3.8 g/dL (ref 3.5–5.2)
Alkaline Phosphatase: 86 U/L (ref 39–117)
BUN: 31 mg/dL — ABNORMAL HIGH (ref 6–23)
CO2: 27 mEq/L (ref 19–32)
Calcium: 10 mg/dL (ref 8.4–10.5)
Chloride: 99 mEq/L (ref 96–112)
Creatinine, Ser: 1.44 mg/dL — ABNORMAL HIGH (ref 0.40–1.20)
GFR: 43.27 mL/min — ABNORMAL LOW (ref >60.00)
Glucose, Bld: 118 mg/dL — ABNORMAL HIGH (ref 70–99)
Potassium: 4.6 mEq/L (ref 3.5–5.1)
Sodium: 133 mEq/L — ABNORMAL LOW (ref 135–145)
Total Bilirubin: 0.3 mg/dL (ref 0.2–1.2)
Total Protein: 7.9 g/dL (ref 6.0–8.3)

## 2019-12-25 LAB — APTT: aPTT: 31.1 s (ref 23.4–32.7)

## 2019-12-25 LAB — PROTIME-INR
INR: 1.1 ratio — ABNORMAL HIGH (ref 0.8–1.0)
Prothrombin Time: 11.8 s (ref 9.6–13.1)

## 2019-12-25 NOTE — Telephone Encounter (Signed)
Spoke with the pt and verified her appts for her pre procedure labs and her covid test and PET scan  Nothing further needed

## 2019-12-26 ENCOUNTER — Inpatient Hospital Stay (HOSPITAL_COMMUNITY): Admission: RE | Admit: 2019-12-26 | Payer: Medicare Other | Source: Ambulatory Visit

## 2019-12-28 ENCOUNTER — Encounter (HOSPITAL_COMMUNITY): Payer: Self-pay | Admitting: Emergency Medicine

## 2019-12-28 ENCOUNTER — Telehealth: Payer: Self-pay | Admitting: Critical Care Medicine

## 2019-12-28 ENCOUNTER — Other Ambulatory Visit (HOSPITAL_COMMUNITY)
Admission: RE | Admit: 2019-12-28 | Discharge: 2019-12-28 | Disposition: A | Discharge status: Home / Self Care | Payer: Medicare Other | Source: Ambulatory Visit | Attending: Emergency Medicine | Admitting: Emergency Medicine

## 2019-12-28 ENCOUNTER — Other Ambulatory Visit: Payer: Self-pay

## 2019-12-28 DIAGNOSIS — Z01812 Encounter for preprocedural laboratory examination: Secondary | ICD-10-CM | POA: Diagnosis present

## 2019-12-28 DIAGNOSIS — Z20822 Contact with and (suspected) exposure to covid-19: Secondary | ICD-10-CM | POA: Diagnosis not present

## 2019-12-28 LAB — SARS CORONAVIRUS 2 (TAT 6-24 HRS): SARS Coronavirus 2: NEGATIVE

## 2019-12-28 NOTE — Telephone Encounter (Signed)
I see that pt has been scheduled for covid test at [facility] this afternoon and she was arrived.  Nothing further needed for this message.

## 2019-12-28 NOTE — Telephone Encounter (Signed)
I spoke with endoscopy about this - I think unfortunately they will not accept the [facility] test.  I asked Endo to call her if not >> she can probably still go to [facility] today and get her test done in time for the results to be back by tomorrow pm.

## 2019-12-28 NOTE — Progress Notes (Signed)
Mrs. Tindall denies chest pain or shortness of breath. Patient was tested for Covid today and is in quarantine by herself.  A daughter from [facility] will drive patient to the hospital and home.  Patient is working on getting someone that she can stay with overnight, she is asking her daughter that lives in [place].

## 2019-12-28 NOTE — Telephone Encounter (Signed)
ATC patient unable to reach left detailed message per her DPR to go to [facility] main entrance to admitting at 1130. They will direct her where to go from there.   Nothing further needed at this time.

## 2019-12-28 NOTE — Telephone Encounter (Signed)
Pt scheduled for EBUS/ENB tomorrow.  She had covid test scheduled at [facility] on Saturday and she missed appt.  [facility]  She wants to know if this is ok.

## 2019-12-29 ENCOUNTER — Ambulatory Visit (HOSPITAL_COMMUNITY): Payer: Medicare Other

## 2019-12-29 ENCOUNTER — Ambulatory Visit (HOSPITAL_COMMUNITY)
Admission: RE | Admit: 2019-12-29 | Discharge: 2019-12-29 | Disposition: A | Discharge status: Home / Self Care | Payer: Medicare Other | Attending: Emergency Medicine | Admitting: Emergency Medicine

## 2019-12-29 ENCOUNTER — Ambulatory Visit (HOSPITAL_COMMUNITY): Payer: Medicare Other | Admitting: Anesthesiology

## 2019-12-29 ENCOUNTER — Encounter (HOSPITAL_COMMUNITY): Payer: Self-pay | Admitting: Emergency Medicine

## 2019-12-29 ENCOUNTER — Encounter (HOSPITAL_COMMUNITY)
Admission: RE | Disposition: A | Discharge status: Home / Self Care | Payer: Self-pay | Source: Home / Self Care | Attending: Emergency Medicine

## 2019-12-29 DIAGNOSIS — F17200 Nicotine dependence, unspecified, uncomplicated: Secondary | ICD-10-CM | POA: Diagnosis not present

## 2019-12-29 DIAGNOSIS — G4733 Obstructive sleep apnea (adult) (pediatric): Secondary | ICD-10-CM | POA: Diagnosis not present

## 2019-12-29 DIAGNOSIS — R911 Solitary pulmonary nodule: Secondary | ICD-10-CM | POA: Diagnosis present

## 2019-12-29 DIAGNOSIS — F329 Major depressive disorder, single episode, unspecified: Secondary | ICD-10-CM | POA: Insufficient documentation

## 2019-12-29 DIAGNOSIS — I10 Essential (primary) hypertension: Secondary | ICD-10-CM | POA: Diagnosis not present

## 2019-12-29 DIAGNOSIS — J449 Chronic obstructive pulmonary disease, unspecified: Secondary | ICD-10-CM | POA: Diagnosis not present

## 2019-12-29 DIAGNOSIS — Z8249 Family history of ischemic heart disease and other diseases of the circulatory system: Secondary | ICD-10-CM | POA: Diagnosis not present

## 2019-12-29 DIAGNOSIS — Z79899 Other long term (current) drug therapy: Secondary | ICD-10-CM | POA: Insufficient documentation

## 2019-12-29 DIAGNOSIS — Z7951 Long term (current) use of inhaled steroids: Secondary | ICD-10-CM | POA: Diagnosis not present

## 2019-12-29 DIAGNOSIS — M1990 Unspecified osteoarthritis, unspecified site: Secondary | ICD-10-CM | POA: Insufficient documentation

## 2019-12-29 DIAGNOSIS — H269 Unspecified cataract: Secondary | ICD-10-CM | POA: Diagnosis not present

## 2019-12-29 DIAGNOSIS — K219 Gastro-esophageal reflux disease without esophagitis: Secondary | ICD-10-CM | POA: Diagnosis not present

## 2019-12-29 DIAGNOSIS — F419 Anxiety disorder, unspecified: Secondary | ICD-10-CM | POA: Diagnosis not present

## 2019-12-29 DIAGNOSIS — Z9889 Other specified postprocedural states: Secondary | ICD-10-CM

## 2019-12-29 HISTORY — PX: BRONCHIAL NEEDLE ASPIRATION BIOPSY: SHX5106

## 2019-12-29 HISTORY — PX: VIDEO BRONCHOSCOPY WITH ENDOBRONCHIAL NAVIGATION: SHX6175

## 2019-12-29 HISTORY — PX: BRONCHIAL BIOPSY: SHX5109

## 2019-12-29 HISTORY — PX: FIDUCIAL MARKER PLACEMENT: SHX6858

## 2019-12-29 HISTORY — PX: BRONCHIAL BRUSHINGS: SHX5108

## 2019-12-29 HISTORY — DX: Unspecified cataract: H26.9

## 2019-12-29 LAB — CBC
HCT: 39.2 % (ref 36.0–46.0)
Hemoglobin: 12.2 g/dL (ref 12.0–15.0)
MCH: 29.7 pg (ref 26.0–34.0)
MCHC: 31.1 g/dL (ref 30.0–36.0)
MCV: 95.4 fL (ref 80.0–100.0)
Platelets: 346 10*3/uL (ref 150–400)
RBC: 4.11 MIL/uL (ref 3.87–5.11)
RDW: 12.3 % (ref 11.5–15.5)
WBC: 8.2 10*3/uL (ref 4.0–10.5)
nRBC: 0 % (ref 0.0–0.2)

## 2019-12-29 LAB — COMPREHENSIVE METABOLIC PANEL
ALT: 16 U/L (ref 0–44)
AST: 21 U/L (ref 15–41)
Albumin: 3.2 g/dL — ABNORMAL LOW (ref 3.5–5.0)
Alkaline Phosphatase: 74 U/L (ref 38–126)
Anion gap: 13 (ref 5–15)
BUN: 32 mg/dL — ABNORMAL HIGH (ref 8–23)
CO2: 22 mmol/L (ref 22–32)
Calcium: 9.7 mg/dL (ref 8.9–10.3)
Chloride: 103 mmol/L (ref 98–111)
Creatinine, Ser: 1.56 mg/dL — ABNORMAL HIGH (ref 0.44–1.00)
GFR calc Af Amer: 38 mL/min — ABNORMAL LOW (ref >60)
GFR calc non Af Amer: 33 mL/min — ABNORMAL LOW (ref >60)
Glucose, Bld: 116 mg/dL — ABNORMAL HIGH (ref 70–99)
Potassium: 5.1 mmol/L (ref 3.5–5.1)
Sodium: 138 mmol/L (ref 135–145)
Total Bilirubin: 0.7 mg/dL (ref 0.3–1.2)
Total Protein: 7.6 g/dL (ref 6.5–8.1)

## 2019-12-29 LAB — PROTIME-INR
INR: 1.1 (ref 0.8–1.2)
Prothrombin Time: 13.3 seconds (ref 11.4–15.2)

## 2019-12-29 LAB — APTT: aPTT: 32 seconds (ref 24–36)

## 2019-12-29 SURGERY — VIDEO BRONCHOSCOPY WITH ENDOBRONCHIAL NAVIGATION
Anesthesia: Monitor Anesthesia Care

## 2019-12-29 MED ORDER — PHENYLEPHRINE 40 MCG/ML (10ML) SYRINGE FOR IV PUSH (FOR BLOOD PRESSURE SUPPORT)
PREFILLED_SYRINGE | INTRAVENOUS | Status: DC | PRN
  Administered 2019-12-29: 160 ug via INTRAVENOUS

## 2019-12-29 MED ORDER — ONDANSETRON HCL 4 MG/2ML IJ SOLN: 4.0000 mg | Freq: Once | INTRAMUSCULAR | Status: DC | PRN

## 2019-12-29 MED ORDER — ROCURONIUM BROMIDE 10 MG/ML (PF) SYRINGE
PREFILLED_SYRINGE | INTRAVENOUS | Status: DC | PRN
  Administered 2019-12-29: 60 mg via INTRAVENOUS
  Administered 2019-12-29: 10 mg via INTRAVENOUS
  Administered 2019-12-29: 20 mg via INTRAVENOUS

## 2019-12-29 MED ORDER — PHENYLEPHRINE HCL-NACL 10-0.9 MG/250ML-% IV SOLN
INTRAVENOUS | Status: DC | PRN
  Administered 2019-12-29: 25 ug/min via INTRAVENOUS

## 2019-12-29 MED ORDER — OXYCODONE HCL 5 MG PO TABS: 5.0000 mg | ORAL_TABLET | Freq: Once | ORAL | Status: DC | PRN

## 2019-12-29 MED ORDER — OXYCODONE HCL 5 MG/5ML PO SOLN: 5.0000 mg | Freq: Once | ORAL | Status: DC | PRN

## 2019-12-29 MED ORDER — FENTANYL CITRATE (PF) 250 MCG/5ML IJ SOLN
INTRAMUSCULAR | Status: DC | PRN
  Administered 2019-12-29 (×2): 50 ug via INTRAVENOUS

## 2019-12-29 MED ORDER — PROPOFOL 10 MG/ML IV BOLUS
INTRAVENOUS | Status: DC | PRN
  Administered 2019-12-29: 100 mg via INTRAVENOUS

## 2019-12-29 MED ORDER — OXYCODONE HCL 5 MG PO TABS
5.0000 mg | ORAL_TABLET | Freq: Once | ORAL | Status: DC | PRN
Start: 2019-12-29 — End: 2019-12-29

## 2019-12-29 MED ORDER — FENTANYL CITRATE (PF) 100 MCG/2ML IJ SOLN
25.0000 ug | INTRAMUSCULAR | Status: DC | PRN
Start: 2019-12-29 — End: 2019-12-29

## 2019-12-29 MED ORDER — FENTANYL CITRATE (PF) 100 MCG/2ML IJ SOLN: 25.0000 ug | INTRAMUSCULAR | Status: DC | PRN

## 2019-12-29 MED ORDER — DEXAMETHASONE SODIUM PHOSPHATE 10 MG/ML IJ SOLN
INTRAMUSCULAR | Status: DC | PRN
  Administered 2019-12-29: 5 mg via INTRAVENOUS

## 2019-12-29 MED ORDER — SUGAMMADEX SODIUM 200 MG/2ML IV SOLN
INTRAVENOUS | Status: DC | PRN
  Administered 2019-12-29: 120 mg via INTRAVENOUS

## 2019-12-29 MED ORDER — LIDOCAINE 2% (20 MG/ML) 5 ML SYRINGE
INTRAMUSCULAR | Status: DC | PRN
  Administered 2019-12-29: 40 mg via INTRAVENOUS

## 2019-12-29 MED ORDER — ONDANSETRON HCL 4 MG/2ML IJ SOLN
INTRAMUSCULAR | Status: DC | PRN
  Administered 2019-12-29: 4 mg via INTRAVENOUS

## 2019-12-29 MED ORDER — LACTATED RINGERS IV SOLN: INTRAVENOUS | Status: DC

## 2019-12-29 MED ORDER — OXYCODONE HCL 5 MG/5ML PO SOLN
5.0000 mg | Freq: Once | ORAL | Status: DC | PRN
Start: 2019-12-29 — End: 2019-12-29

## 2019-12-29 NOTE — Anesthesia Procedure Notes (Signed)
Procedure Name: Intubation Date/Time: 12/29/2019 12:16 PM Performed by: Bryson Corona, CRNA Pre-anesthesia Checklist: Patient identified, Emergency Drugs available, Suction available and Patient being monitored Patient Re-evaluated:Patient Re-evaluated prior to induction Oxygen Delivery Method: Circle System Utilized Preoxygenation: Pre-oxygenation with 100% oxygen Induction Type: IV induction Ventilation: Mask ventilation without difficulty Laryngoscope Size: Mac and 3 Grade View: Grade I Tube type: Oral Tube size: 8.5 mm Number of attempts: 1 Airway Equipment and Method: Stylet and Oral airway Placement Confirmation: ETT inserted through vocal cords under direct vision,  positive ETCO2 and breath sounds checked- equal and bilateral Secured at: 21 cm Tube secured with: Tape Dental Injury: Teeth and Oropharynx as per pre-operative assessment

## 2019-12-29 NOTE — Anesthesia Preprocedure Evaluation (Signed)
Anesthesia Evaluation  Patient identified by MRN, date of birth, ID band Patient awake    Reviewed: Allergy & Precautions, NPO status , Patient's Chart, lab work & pertinent test results  Airway Mallampati: II  TM Distance: >3 FB Neck ROM: Full    Dental  (+) Teeth Intact, Dental Advisory Given   Pulmonary Current Smoker,    breath sounds clear to auscultation       Cardiovascular hypertension,  Rhythm:Regular Rate:Normal     Neuro/Psych    GI/Hepatic   Endo/Other    Renal/GU      Musculoskeletal   Abdominal   Peds  Hematology   Anesthesia Other Findings   Reproductive/Obstetrics                            Anesthesia Physical Anesthesia Plan  ASA: III  Anesthesia Plan: MAC   Post-op Pain Management:    Induction: Intravenous  PONV Risk Score and Plan: Ondansetron and Dexamethasone  Airway Management Planned: Natural Airway and Nasal Cannula  Additional Equipment:   Intra-op Plan:   Post-operative Plan:   Informed Consent: I have reviewed the patients History and Physical, chart, labs and discussed the procedure including the risks, benefits and alternatives for the proposed anesthesia with the patient or authorized representative who has indicated his/her understanding and acceptance.   Dental advisory given  Plan Discussed with: CRNA and Anesthesiologist  Anesthesia Plan Comments:         Anesthesia Quick Evaluation  

## 2019-12-29 NOTE — H&P (Signed)
Katrina Ball is an 74 y.o. female.   Chief Complaint: Right middle lobe pulmonary nodule HPI:  74 year old active smoker followed in our office for COPD.  Also with hypertension, GERD, obstructive sleep apnea.  She was found to have a new peripheral right middle lobe nodule, 1.5 cm on CT scan of the chest done 12/21/2019.  Recommendation was made to proceed with tissue diagnosis via navigational bronchoscopy.  She denies any dyspnea, chest discomfort, cough.  She is compliant with her Anoro and used it this morning.  She also used her albuterol.  She is not on any anticoagulation.  Past Medical History:  Diagnosis Date  . Anemia    "used to be" (12/23/2012)  . Anxiety   . Arrhythmia   . Arthritis   . Cataract   . Chest pain   . COPD (chronic obstructive pulmonary disease) ([place])   . Depression   . Exertional shortness of breath   . Fracture of tibial plateau, closed 12/23/2012  . GERD (gastroesophageal reflux disease)   . Heart murmur 2016   Dr. Aundra Dubin did not hear a murmer  . Hypertension   . Numbness and tingling in hands   . Sleep apnea     Past Surgical History:  Procedure Laterality Date  . CARDIAC CATHETERIZATION N/A 02/17/2015   Procedure: Right Heart Cath;  Surgeon: Larey Dresser, MD;  Location: [facility];  Service: Cardiovascular;  Laterality: N/A;  . COLONOSCOPY    . INCISION AND DRAINAGE OF WOUND  12/07/2012   "LLE" (12/23/2012)  . ORIF PROXIMAL TIBIAL PLATEAU FRACTURE Left 12/23/2012  . ORIF TIBIA PLATEAU Left 12/23/2012   Procedure: OPEN REDUCTION INTERNAL FIXATION (ORIF) LEFT TIBIAL PLATEAU;  Surgeon: Johnny Bridge, MD;  Location: [facility];  Service: Orthopedics;  Laterality: Left;  . TUBAL LIGATION      Family History  Problem Relation Age of Onset  . CVA Mother   . Heart failure Mother   . Heart disease Mother   . Asthma Mother    Social History:  reports that she has been smoking cigarettes. She has a 11.25 pack-year smoking history. She has never used  smokeless tobacco. She reports previous alcohol use. She reports that she does not use drugs.  Allergies: No Known Allergies  Medications Prior to Admission  Medication Sig Dispense Refill  . albuterol ([name] Inhale 2 puffs into the lungs every 4 (four) hours as needed for wheezing or shortness of breath. 18 g 5  . Carboxymethylcellul-Glycerin (LUBRICATING EYE DROPS OP) Place 1 drop into both eyes 2 (two) times daily as needed (dry eyes).    Marland Kitchen FLUoxetine (PROZAC) 20 MG capsule Take 60 mg by mouth daily.    . Magnesium 400 MG TABS Take 400 mg by mouth daily.    . naproxen sodium (ALEVE) 220 MG tablet Take 220 mg by mouth daily as needed (pain).    . nicotine polacrilex (NICORETTE) 4 MG gum Take 4 mg by mouth as needed for smoking cessation.    Marland Kitchen olmesartan-hydrochlorothiazide (BENICAR HCT) 40-25 MG tablet Take 1 tablet by mouth daily.    . Psyllium (METAMUCIL PO) Take 1 Dose by mouth daily as needed (constipation).    Marland Kitchen umeclidinium-vilanterol (ANORO ELLIPTA) 62.5-25 MCG/INH AEPB Inhale 1 puff into the lungs daily. 1 each 11  . fluticasone furoate-vilanterol (BREO ELLIPTA) 100-25 MCG/INH AEPB Inhale 1 puff into the lungs daily. (Patient not taking: Reported on 12/25/2019) 1 each 0  .  loratadine (CLARITIN) 10 MG tablet Take 10 mg by mouth daily.      Results for orders placed or performed during the hospital encounter of 12/29/19 (from the past 48 hour(s))  CBC     Status: None   Collection Time: 12/29/19 11:09 AM  Result Value Ref Range   WBC 8.2 4.0 - 10.5 K/uL   RBC 4.11 3.87 - 5.11 MIL/uL   Hemoglobin 12.2 12.0 - 15.0 g/dL   HCT 39.2 36.0 - 46.0 %   MCV 95.4 80.0 - 100.0 fL   MCH 29.7 26.0 - 34.0 pg   MCHC 31.1 30.0 - 36.0 g/dL   RDW 12.3 11.5 - 15.5 %   Platelets 346 150 - 400 K/uL   nRBC 0.0 0.0 - 0.2 %    Comment: Performed at [facility], [place] [address], [place], [place] [address]  Protime-INR     Status: None   Collection Time:  12/29/19 11:09 AM  Result Value Ref Range   Prothrombin Time 13.3 11.4 - 15.2 seconds   INR 1.1 0.8 - 1.2    Comment: (NOTE) INR goal varies based on device and disease states. Performed at [facility], [place] [address], [place], [place] [address]   APTT     Status: None   Collection Time: 12/29/19 11:09 AM  Result Value Ref Range   aPTT 32 24 - 36 seconds    Comment: Performed at [facility] [address] [place], [place] [address]   No results found.  Review of Systems As above  Vitals:   12/28/19 1538  Weight: 63.5 kg  Height: 5\' 5"  (1.651 m)    Height 5\' 5"  (1.651 m), weight 63.5 kg. Physical Exam  Gen: Pleasant, well-nourished, in no distress,  normal affect  ENT: No lesions,  mouth clear,  oropharynx clear, no postnasal drip, M1 airway  Neck: No JVD, no stridor  Lungs: No use of accessory muscles, distant, no crackles or wheezing on normal respiration, no wheeze on forced expiration  Cardiovascular: RRR, heart sounds normal, no murmur or gallops, no peripheral edema  Musculoskeletal: No deformities, no cyanosis or clubbing  Neuro: alert, awake, non focal  Skin: Warm, no lesions or rash   BMP Latest Ref Rng & Units 12/29/2019 12/25/2019 02/17/2015  Glucose 70 - 99 mg/dL 116(H) 118(H) 114(H)  BUN 8 - 23 mg/dL 32(H) 31(H) 15  Creatinine 0.44 - 1.00 mg/dL 1.56(H) 1.44(H) 1.20(H)  Sodium 135 - 145 mmol/L 138 133(L) 135  Potassium 3.5 - 5.1 mmol/L 5.1 4.6 3.7  Chloride 98 - 111 mmol/L 103 99 101  CO2 22 - 32 mmol/L 22 27 28   Calcium 8.9 - 10.3 mg/dL 9.7 10.0 9.5   CBC Latest Ref Rng & Units 12/29/2019 12/25/2019 02/17/2015  WBC 4.0 - 10.5 K/uL 8.2 7.7 6.2  Hemoglobin 12.0 - 15.0 g/dL 12.2 12.4 13.0  Hematocrit 36.0 - 46.0 % 39.2 37.6 40.2  Platelets 150 - 400 K/uL 346 390.0 239   INR 3/11 >> 1.1   Assessment/Plan Right middle lobe pulmonary nodule in a smoker.  Moderate to high risk for primary lung cancer.  Plan for navigational bronchoscopy  with biopsies for pathology and cytology.  Procedure explained to the patient including risks and benefits.  All questions answered.  She understands and agrees to proceed.  No barriers identified.  Collene Gobble, MD 12/29/2019, 11:58 AM

## 2019-12-29 NOTE — Telephone Encounter (Signed)
Error

## 2019-12-29 NOTE — Op Note (Signed)
Video Bronchoscopy with Electromagnetic Navigation Procedure Note  Date of Operation: 12/29/2019  Pre-op Diagnosis: Right middle lobe nodule  Post-op Diagnosis: Same  Surgeon: Baltazar Apo  Assistants: None  Anesthesia: General endotracheal anesthesia  Operation: Flexible video fiberoptic bronchoscopy with electromagnetic navigation and biopsies.  Estimated Blood Loss: Minimal  Complications: None apparent  Indications and History: Katrina Ball is a 74 y.o. female with history of tobacco use.  She was found to have a new 1.5 cm peripheral right middle lobe nodule by CT scan of the chest.  Recommendation was made to achieve a tissue diagnosis via navigational bronchoscopy.  The risks, benefits, complications, treatment options and expected outcomes were discussed with the patient.  The possibilities of pneumothorax, pneumonia, reaction to medication, pulmonary aspiration, perforation of a viscus, bleeding, failure to diagnose a condition and creating a complication requiring transfusion or operation were discussed with the patient who freely signed the consent.    Description of Procedure: The patient was seen in the Preoperative Area, was examined and was deemed appropriate to proceed.  The patient was taken to [facility] endoscopy room two, identified as Katrina Ball and the procedure verified as Flexible Video Fiberoptic Bronchoscopy.  A Time Out was held and the above information confirmed.   Prior to the date of the procedure a high-resolution CT scan of the chest was performed. Utilizing [place] a virtual tracheobronchial tree was generated to allow the creation of distinct navigation pathways to the patient's parenchymal abnormalities. After being taken to the operating room general anesthesia was initiated and the patient  was orally intubated. The video fiberoptic bronchoscope was introduced via the endotracheal tube and a general inspection was performed  which showed normal airways throughout.  There were no endobronchial lesions or abnormal secretions seen.  Two right mainstem endobronchial brushings were performed to facilitate Percepta testing should this be indicated.  The extendable working channel and locator guide were introduced into the bronchoscope. The distinct navigation pathways prepared prior to this procedure were then utilized to navigate to within 0.4 cm of patient's lesion identified on CT scan. The extendable working channel was secured into place and the locator guide was withdrawn. Under fluoroscopic guidance transbronchial needle brushings, transbronchial Wang needle biopsies, and transbronchial forceps biopsies were performed to be sent for cytology and pathology.  Three fiducial markers were placed under fluoroscopic guidance triangulating the right middle lobe nodule to facilitate radiation therapy should it be indicated going forward.  At the end of the procedure a general airway inspection was performed and there was no evidence of active bleeding. The bronchoscope was removed.  The patient tolerated the procedure well. There was no significant blood loss and there were no obvious complications. A post-procedural chest x-ray is pending.  Samples: 1. Transbronchial needle brushings from right middle lobe nodule 2. Transbronchial Wang needle biopsies from right middle lobe nodule 3. Transbronchial forceps biopsies from right middle lobe nodule 4. Endobronchial brushings from right mainstem bronchus  Plans:  The patient will be discharged from the PACU to home when recovered from anesthesia and after chest x-ray is reviewed. We will review the cytology, pathology results with the patient when they become available. Outpatient followup will be with Dr Carlis Abbott.    Baltazar Apo, MD, PhD 12/29/2019, 2:03 PM [facility] [phone] or if no answer [phone]

## 2019-12-29 NOTE — Transfer of Care (Signed)
Immediate Anesthesia Transfer of Care Note  Patient: Katrina Ball  Procedure(s) Performed: VIDEO BRONCHOSCOPY WITH ENDOBRONCHIAL NAVIGATION (N/A ) BRONCHIAL BRUSHINGS BRONCHIAL NEEDLE ASPIRATION BIOPSIES BRONCHIAL BIOPSIES FIDUCIAL MARKER PLACEMENT  Patient Location: PACU  Anesthesia Type:General  Level of Consciousness: awake, alert  and oriented  Airway & Oxygen Therapy: Patient Spontanous Breathing and Patient connected to face mask oxygen  Post-op Assessment: Report given to RN and Post -op Vital signs reviewed and stable  Post vital signs: Reviewed and stable  Last Vitals:  Vitals Value Taken Time  BP 124/63 12/29/19 1413  Temp    Pulse 89 12/29/19 1416  Resp 19 12/29/19 1416  SpO2 100 % 12/29/19 1416  Vitals shown include unvalidated device data.  Last Pain:  Vitals:   12/29/19 1101  PainSc: 0-No pain         Complications: No apparent anesthesia complications

## 2019-12-29 NOTE — Discharge Instructions (Signed)
Flexible Bronchoscopy, Care After This sheet gives you information about how to care for yourself after your test. Your doctor may also give you more specific instructions. If you have problems or questions, contact your doctor. Follow these instructions at home: Eating and drinking  Do not eat or drink anything (not even water) for 2 hours after your test, or until your numbing medicine (local anesthetic) wears off.  When your numbness is gone and your cough and gag reflexes have come back, you may: ? Eat only soft foods. ? Slowly drink liquids.  The day after the test, go back to your normal diet. Driving  Do not drive for 24 hours if you were given a medicine to help you relax (sedative).  Do not drive or use heavy machinery while taking prescription pain medicine. General instructions   Take over-the-counter and prescription medicines only as told by your doctor.  Return to your normal activities as told. Ask what activities are safe for you.  Do not use any products that have nicotine or tobacco in them. This includes cigarettes and e-cigarettes. If you need help quitting, ask your doctor.  Keep all follow-up visits as told by your doctor. This is important. It is very important if you had a tissue sample (biopsy) taken. Get help right away if:  You have shortness of breath that gets worse.  You get light-headed.  You feel like you are going to pass out (faint).  You have chest pain.  You cough up: ? More than a little blood. ? More blood than before. Summary  Do not eat or drink anything (not even water) for 2 hours after your test, or until your numbing medicine wears off.  Do not use cigarettes. Do not use e-cigarettes.  Get help right away if you have chest pain.   Please call our office for any questions or concerns.  [phone].  This information is not intended to replace advice given to you by your health care provider. Make sure you discuss any  questions you have with your health care provider. Document Revised: 07/19/2017 Document Reviewed: 08/24/2016 Elsevier Patient Education  2020 [employer].

## 2019-12-30 LAB — CYTOLOGY - NON PAP

## 2019-12-30 LAB — SURGICAL PATHOLOGY

## 2019-12-30 NOTE — Anesthesia Postprocedure Evaluation (Signed)
Anesthesia Post Note  Patient: Katrina Ball  Procedure(s) Performed: VIDEO BRONCHOSCOPY WITH ENDOBRONCHIAL NAVIGATION (N/A ) BRONCHIAL BRUSHINGS BRONCHIAL NEEDLE ASPIRATION BIOPSIES BRONCHIAL BIOPSIES FIDUCIAL MARKER PLACEMENT     Anesthesia Post Evaluation  Last Vitals:  Vitals:   12/29/19 1430 12/29/19 1444  BP:  (!) 123/54  Pulse: 85 87  Resp: 14 (!) 23  Temp:  36.8 C  SpO2: 95% 100%    Last Pain:  Vitals:   12/29/19 1444  PainSc: 0-No pain                 Conya Ellinwood COKER

## 2019-12-31 ENCOUNTER — Telehealth: Payer: Self-pay | Admitting: Emergency Medicine

## 2019-12-31 DIAGNOSIS — C342 Malignant neoplasm of middle lobe, bronchus or lung: Secondary | ICD-10-CM

## 2019-12-31 NOTE — Telephone Encounter (Signed)
I spoke with the patient, reviewed cytology results with her.  They were able to call malignant cells but I do not see any thing more specific.  Will check to see if there are any immunohistochemical stains pending.  The biopsies were negative.  I did place fiducials and she is likely a good candidate for radiation.  She is not a surgical candidate.  She has a PET scan scheduled for 5/17.  I will refer her to [facility] for further evaluation and to plan treatment.  FYI to Dr Carlis Abbott

## 2019-12-31 NOTE — Telephone Encounter (Signed)
Thanks for the update and for taking care of her!

## 2020-01-01 ENCOUNTER — Telehealth: Payer: Self-pay | Admitting: *Deleted

## 2020-01-01 ENCOUNTER — Telehealth: Payer: Self-pay | Admitting: Emergency Medicine

## 2020-01-01 NOTE — Telephone Encounter (Signed)
Oncology Nurse Navigator Documentation  Oncology Nurse Navigator Flowsheets 01/01/2020  Navigator Location [facility]  Referral Date to RadOnc/MedOnc 12/31/2019  Navigator Encounter Type Telephone/I received referral on Katrina Ball.  I called and scheduled her to be seen at [facility] next week.  She verbalized understanding of appt time and place.   Telephone Outgoing Call  Barriers/Navigation Needs Coordination of Care  Interventions Coordination of Care  Acuity Level 2-Minimal Needs (1-2 Barriers Identified)  Time Spent with Patient 30

## 2020-01-01 NOTE — Telephone Encounter (Signed)
Called and spoke with pt's daughter Apolonio Schneiders letting her know the info stated by RB about pt's biopsy. Stated to her that she has been scheduled for an appt with radiation oncology Thurs 5/20 and she verbalized understanding.  Nothing further needed.

## 2020-01-04 ENCOUNTER — Ambulatory Visit (HOSPITAL_COMMUNITY)
Admission: RE | Admit: 2020-01-04 | Discharge: 2020-01-04 | Disposition: A | Discharge status: Home / Self Care | Payer: Medicare Other | Source: Ambulatory Visit | Attending: Critical Care Medicine | Admitting: Critical Care Medicine

## 2020-01-04 ENCOUNTER — Telehealth: Payer: Self-pay | Admitting: Critical Care Medicine

## 2020-01-04 ENCOUNTER — Other Ambulatory Visit: Payer: Self-pay

## 2020-01-04 DIAGNOSIS — J438 Other emphysema: Secondary | ICD-10-CM

## 2020-01-04 DIAGNOSIS — I2510 Atherosclerotic heart disease of native coronary artery without angina pectoris: Secondary | ICD-10-CM | POA: Insufficient documentation

## 2020-01-04 DIAGNOSIS — R911 Solitary pulmonary nodule: Secondary | ICD-10-CM | POA: Insufficient documentation

## 2020-01-04 DIAGNOSIS — I700 Atherosclerosis of aorta: Secondary | ICD-10-CM | POA: Insufficient documentation

## 2020-01-04 DIAGNOSIS — Z720 Tobacco use: Secondary | ICD-10-CM | POA: Diagnosis present

## 2020-01-04 LAB — GLUCOSE, CAPILLARY: Glucose-Capillary: 108 mg/dL — ABNORMAL HIGH (ref 70–99)

## 2020-01-04 MED ORDER — FLUDEOXYGLUCOSE F - 18 (FDG) INJECTION
7.0300 | Freq: Once | INTRAVENOUS | Status: AC | PRN
  Administered 2020-01-04: 7.03 via INTRAVENOUS

## 2020-01-04 NOTE — Telephone Encounter (Signed)
I called Ms. Katrina Ball to let her know her PET scan results- limited PET-avidity to the RML nodule. No evidence of mets. She has an appt with Rad-Onc on 01/07/20.  She is working on quitting smoking. She wants to try nicotine replacement gum.   Julian Hy, DO 01/04/20 12:10 PM  Pulmonary & Critical Care

## 2020-01-07 ENCOUNTER — Other Ambulatory Visit: Payer: Self-pay | Admitting: *Deleted

## 2020-01-07 ENCOUNTER — Ambulatory Visit
Admission: RE | Admit: 2020-01-07 | Discharge: 2020-01-07 | Disposition: A | Discharge status: Home / Self Care | Payer: Medicare Other | Source: Ambulatory Visit | Attending: Radiation Oncology | Admitting: Radiation Oncology

## 2020-01-07 ENCOUNTER — Other Ambulatory Visit: Payer: Self-pay

## 2020-01-07 DIAGNOSIS — C342 Malignant neoplasm of middle lobe, bronchus or lung: Secondary | ICD-10-CM | POA: Insufficient documentation

## 2020-01-07 NOTE — Progress Notes (Signed)
The proposed treatment discussed in cancer conference 01/07/20 is for discussion purpose only and is not a binding recommendation.  The patient was not physically examined nor present for their treatment options.  Therefore, final treatment plans cannot be decided.  

## 2020-01-07 NOTE — Progress Notes (Signed)
Radiation Oncology         [phone] ________________________________  Name: Katrina Ball        MRN: [id number]  Date of Service: 01/07/2020 DOB: 29-Dec-1945  VF:IEPP, Dola Factor, MD  Bartholome Bill, MD     REFERRING PHYSICIAN: Dr. 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   HISTORY OF PRESENT ILLNESS: Katrina Ball is a 73 y.o. female seen at the request of Dr. Lamonte Sakai for a newly diagnosed right lung cancer.  The patient has been followed by pulmonary medicine, and has a history of pulmonary nodules dating back to 2016.  She has been followed specifically for a nodule in the right middle lobe, and imaging on 12/21/2019 revealed moderate emphysematous changes bilaterally in the lungs, with septal thickening in the lung apices and an area of scarring in the left upper lobe.  There is a new peripheral pulmonary nodule in the right middle lobe measuring 1.5 cm in retraction of the nearby minor fissure.  No adenopathy was identified.  There was an incidental 1.6 cm nodule in the isthmus of the thyroid gland. There was also stigmata of coronary artery disease.  She underwent bronchoscopy with Dr. Lamonte Sakai on 12/29/2019, and final results of the brushing and fine-needle aspirate of the right middle lobe lesion were consistent with malignancy, representing non-small cell lung cancer.  The biopsy of the right middle lobe lung specimen was benign lung parenchyma.  There was note additional tissue to send categorize her pathology or for cell block.  Of note fiducial markers were placed at the time of her procedure.  She did undergo a PET scan on 01/04/2020, the area in the right middle lobe measured 1.3 x 1.4 cm and had an SUV max of 7.9, there was no evidence of hypermetabolism elsewhere in the chest, and no evidence of metastatic disease was otherwise noted.  She is seen today to discuss options of surgical resection versus stereotactic  body radiotherapy (SBRT).   PREVIOUS RADIATION THERAPY: No   PAST MEDICAL HISTORY:  Past Medical History:  Diagnosis Date  . Anemia    "used to be" (12/23/2012)  . Anxiety   . Arrhythmia   . Arthritis   . Cataract   . Chest pain   . COPD (chronic obstructive pulmonary disease) ([place])   . Depression   . Exertional shortness of breath   . Fracture of tibial plateau, closed 12/23/2012  . GERD (gastroesophageal reflux disease)   . Heart murmur 2016   Dr. Aundra Dubin did not hear a murmer  . Hypertension   . Numbness and tingling in hands   . Sleep apnea        PAST SURGICAL HISTORY: Past Surgical History:  Procedure Laterality Date  . BRONCHIAL BIOPSY  12/29/2019   Procedure: BRONCHIAL BIOPSIES;  Surgeon: Collene Gobble, MD;  Location: [facility] ENDOSCOPY;  Service: Pulmonary;;  . BRONCHIAL BRUSHINGS  12/29/2019   Procedure: BRONCHIAL BRUSHINGS;  Surgeon: Collene Gobble, MD;  Location: [facility] ENDOSCOPY;  Service: Pulmonary;;  . BRONCHIAL NEEDLE ASPIRATION BIOPSY  12/29/2019   Procedure: BRONCHIAL NEEDLE ASPIRATION BIOPSIES;  Surgeon: Collene Gobble, MD;  Location: [place] ENDOSCOPY;  Service: Pulmonary;;  . CARDIAC CATHETERIZATION N/A 02/17/2015   Procedure: Right Heart Cath;  Surgeon: Larey Dresser, MD;  Location: [place] CV LAB;  Service: Cardiovascular;  Laterality: N/A;  . COLONOSCOPY    . FIDUCIAL MARKER PLACEMENT  12/29/2019   Procedure:  FIDUCIAL MARKER PLACEMENT;  Surgeon: Collene Gobble, MD;  Location: [facility] ENDOSCOPY;  Service: Pulmonary;;  . INCISION AND DRAINAGE OF WOUND  12/07/2012   "LLE" (12/23/2012)  . ORIF PROXIMAL TIBIAL PLATEAU FRACTURE Left 12/23/2012  . ORIF TIBIA PLATEAU Left 12/23/2012   Procedure: OPEN REDUCTION INTERNAL FIXATION (ORIF) LEFT TIBIAL PLATEAU;  Surgeon: Johnny Bridge, MD;  Location: [place];  Service: Orthopedics;  Laterality: Left;  . TUBAL LIGATION    . VIDEO BRONCHOSCOPY WITH ENDOBRONCHIAL NAVIGATION N/A 12/29/2019   Procedure: VIDEO BRONCHOSCOPY WITH  ENDOBRONCHIAL NAVIGATION;  Surgeon: Collene Gobble, MD;  Location: [place] ENDOSCOPY;  Service: Pulmonary;  Laterality: N/A;     FAMILY HISTORY:  Family History  Problem Relation Age of Onset  . CVA Mother   . Heart failure Mother   . Heart disease Mother   . Asthma Mother      SOCIAL HISTORY:  reports that she has been smoking cigarettes. She has a 11.25 pack-year smoking history. She has never used smokeless tobacco. She reports previous alcohol use. She reports that she does not use drugs. The patient is widowed and lives in [place] and is accompanied by her son.   ALLERGIES: Patient has no known allergies.   MEDICATIONS:  Current Outpatient Medications  Medication Sig Dispense Refill  . albuterol ([name] Inhale 2 puffs into the lungs every 4 (four) hours as needed for wheezing or shortness of breath. 18 g 5  . Carboxymethylcellul-Glycerin (LUBRICATING EYE DROPS OP) Place 1 drop into both eyes 2 (two) times daily as needed (dry eyes).    Marland Kitchen FLUoxetine (PROZAC) 20 MG capsule Take 60 mg by mouth daily.    Marland Kitchen loratadine (CLARITIN) 10 MG tablet Take 10 mg by mouth daily.    . Magnesium 400 MG TABS Take 400 mg by mouth daily.    . naproxen sodium (ALEVE) 220 MG tablet Take 220 mg by mouth daily as needed (pain).    . nicotine polacrilex (NICORETTE) 4 MG gum Take 4 mg by mouth as needed for smoking cessation.    Marland Kitchen olmesartan-hydrochlorothiazide (BENICAR HCT) 40-25 MG tablet Take 1 tablet by mouth daily.    . Psyllium (METAMUCIL PO) Take 1 Dose by mouth daily as needed (constipation).    Marland Kitchen umeclidinium-vilanterol (ANORO ELLIPTA) 62.5-25 MCG/INH AEPB Inhale 1 puff into the lungs daily. 1 each 11   No current facility-administered medications for this encounter.     REVIEW OF SYSTEMS: On review of systems, the patient reports that she is doing well overall. She is at times short of breath exertion. She denies any chest pain, shortness of breath at  rest. She has spells of dry coughing, but denies  fevers, chills, night sweats, unintended weight changes. She denies any bowel or bladder disturbances, and denies abdominal pain, nausea or vomiting. She  denies any new musculoskeletal or joint aches or pains. A complete review of systems is obtained and is otherwise negative.     PHYSICAL EXAM:  Wt Readings from Last 3 Encounters:  12/28/19 140 lb (63.5 kg)  09/22/19 143 lb 12.8 oz (65.2 kg)  07/14/19 143 lb 12.8 oz (65.2 kg)   Temp Readings from Last 3 Encounters:  12/29/19 98.3 F (36.8 C)  07/14/19 97.8 F (36.6 C) (Temporal)  06/29/19 98.4 F (36.9 C) (Temporal)   BP Readings from Last 3 Encounters:  12/29/19 (!) 123/54  09/22/19 (!) 150/80  07/14/19 (!) 142/70   Pulse Readings from Last 3  Encounters:  12/29/19 87  09/22/19 87  07/14/19 88   In general this is a well appearing African American female in no acute distress. She's alert and oriented x4 and appropriate throughout the examination. Cardiopulmonary assessment is negative for acute distress and she exhibits normal effort.     ECOG = 1  0 - Asymptomatic (Fully active, able to carry on all predisease activities without restriction)  1 - Symptomatic but completely ambulatory (Restricted in physically strenuous activity but ambulatory and able to carry out work of a light or sedentary nature. For example, light housework, office work)  2 - Symptomatic, <50% in bed during the day (Ambulatory and capable of all self care but unable to carry out any work activities. Up and about more than 50% of waking hours)  3 - Symptomatic, >50% in bed, but not bedbound (Capable of only limited self-care, confined to bed or chair 50% or more of waking hours)  4 - Bedbound (Completely disabled. Cannot carry on any self-care. Totally confined to bed or chair)  5 - Death   Eustace Pen MM, Creech RH, Tormey DC, et al. [phone]). "Toxicity and response criteria of the [facility] Group". [place] Oncol. 5 (6): 649-55    LABORATORY DATA:  Lab Results  Component Value Date   WBC 8.2 12/29/2019   HGB 12.2 12/29/2019   HCT 39.2 12/29/2019   MCV 95.4 12/29/2019   PLT 346 12/29/2019   Lab Results  Component Value Date   NA 138 12/29/2019   K 5.1 12/29/2019   CL 103 12/29/2019   CO2 22 12/29/2019   Lab Results  Component Value Date   ALT 16 12/29/2019   AST 21 12/29/2019   ALKPHOS 74 12/29/2019   BILITOT 0.7 12/29/2019      RADIOGRAPHY: CT Chest Wo Contrast  Result Date: 12/21/2019 CLINICAL DATA:  Pulmonary nodule. EXAM: CT CHEST WITHOUT CONTRAST TECHNIQUE: Multidetector CT imaging of the chest was performed following the standard protocol without IV contrast. COMPARISON:  Jan 13, 2015 FINDINGS: Cardiovascular: The heart size is normal. There is a trace pericardial effusion. Atherosclerotic changes are noted of the thoracic aorta without evidence for an aneurysm. There are coronary artery calcifications. Mediastinum/Nodes: --No mediastinal or hilar lymphadenopathy. --No axillary lymphadenopathy. --No supraclavicular lymphadenopathy. --there is a 1.6 cm nodule within the isthmus of the thyroid gland. --The esophagus is unremarkable Lungs/Pleura: Moderate emphysematous changes are noted bilaterally. There is some mild interlobular septal thickening at the lung apices. There is an area of scarring in the left upper lobe (axial series 5, image 29). There is a new peripheral pulmonary nodule in the right middle lobe measuring approximately 1.5 cm (axial series 5, image 95). There is retraction of the nearby minor fissure with a subtle adjacent desmoplastic reaction. There is no pneumothorax or pleural effusion. Upper Abdomen: No acute abnormality. Musculoskeletal: No chest wall abnormality. No acute or significant osseous findings. Review of the MIP images confirms the above findings. IMPRESSION: 1. There is a new 1.5 cm peripheral pulmonary nodule in the right  middle lobe. This is concerning for malignancy. Pulmonary medicine follow-up is recommended. 2. There is a 1.6 cm nodule within the isthmus of the thyroid gland. Recommend thyroid US.(Ref: J Am Coll Radiol. 2015 Feb;12(2): 143-50). 3. Coronary artery disease. Aortic Atherosclerosis (ICD10-I70.0) and Emphysema (ICD10-J43.9). Electronically Signed   By: Constance Holster M.D.   On: 12/21/2019 19:16   NM PET Image Initial (PI) Skull Base To Thigh  Result Date:  01/04/2020 CLINICAL DATA:  Initial treatment strategy for lung nodule. EXAM: NUCLEAR MEDICINE PET SKULL BASE TO THIGH TECHNIQUE: 7.0 mCi F-18 FDG was injected intravenously. Full-ring PET imaging was performed from the skull base to thigh after the radiotracer. CT data was obtained and used for attenuation correction and anatomic localization. Fasting blood glucose: 108 mg/dl COMPARISON:  CT chest 12/21/2019 and 01/13/2015. FINDINGS: Mediastinal blood pool activity: SUV max 3.0 Liver activity: SUV max NA NECK: No abnormal hypermetabolism. Focal hypermetabolism associated with the left maxillary sinus is likely infectious/inflammatory in etiology. Incidental CT findings: Near complete opacification of the left maxillary sinus with surrounding bone thickening, indicating chronicity. CHEST: No hypermetabolic mediastinal, hilar or axillary lymph nodes. Irregular 1.3 x 1.4 cm nodule in the lateral segment right middle lobe (8/51) has an SUV max of 7.9. Incidental CT findings: Atherosclerotic calcification of the aorta and coronary arteries. Heart size normal. No pericardial or pleural effusion. Centrilobular emphysema. Fiducial markers in the lateral segment right middle lobe. Mesentery motion degrades image quality. ABDOMEN/PELVIS: No abnormal hypermetabolism in the liver, adrenal glands, spleen or pancreas. No hypermetabolic lymph nodes. Incidental CT findings: Liver, gallbladder and right adrenal gland are unremarkable. Slight thickening of the left adrenal  gland. Visualized portions of the kidneys, spleen, pancreas, stomach and bowel are grossly unremarkable. Atherosclerotic calcification of the aorta. SKELETON: No abnormal osseous hypermetabolism. Incidental CT findings: Degenerative changes in the spine. IMPRESSION: 1. Hypermetabolic right middle lobe nodule, consistent with stage IA primary bronchogenic carcinoma. 2. Aortic atherosclerosis (ICD10-I70.0). Coronary artery calcification. 3.  Emphysema (ICD10-J43.9). Electronically Signed   By: Lorin Picket M.D.   On: 01/04/2020 10:58   DG Chest Port 1 View  Result Date: 12/29/2019 CLINICAL DATA:  74 year old female status post bronchoscopy with biopsy. EXAM: PORTABLE CHEST 1 VIEW COMPARISON:  Chest CT 12/21/2019 and earlier. FINDINGS: Portable AP semi upright view at 1412 hours. There are new surgical clips projecting in the right mid to lower lung. Stable lung volumes and mediastinal contours with no pneumothorax. No new pulmonary opacity. Stable visualized osseous structures. Chronic left clavicle fracture. Paucity bowel gas in the upper abdomen. IMPRESSION: No pneumothorax or new cardiopulmonary abnormality following bronchoscopic biopsy. Electronically Signed   By: Genevie Ann M.D.   On: 12/29/2019 14:41   DG C-ARM BRONCHOSCOPY  Result Date: 12/29/2019 C-ARM BRONCHOSCOPY: Fluoroscopy was utilized by the requesting physician.  No radiographic interpretation.       IMPRESSION/PLAN: 1. Stage IA2, cT1bN0M0, NSCLC of the right middle lobe. Dr. Lisbeth Renshaw discusses the pathology findings and reviews the nature of early stage lung cancer. We contacted Dr. Lamonte Sakai, and his recommendations against surgery are based on her PFTs from a few years ago. Dr Servando Snare was also aware and agreed that based on these values, surgery would possibly more risky in terms of her pulmonary function rather than stereotactic body radiotherapy (SBRT). Dr. Lisbeth Renshaw spent time discussing these options for radiation.  We discussed the risks,  benefits, short, and long term effects of radiotherapy, and the patient going to discuss first with her family before making plans for proceeding. Dr. Lisbeth Renshaw discusses the delivery and logistics of radiotherapy and anticipates a course of 3-5 fractions of radiotherapy. We will coordinate simulation in the next week or so once she's made decisions on how she'd like to proceed.  In a visit lasting 60 minutes, greater than 50% of the time was spent face to face discussing the patient's condition, in preparation for the discussion, and coordinating the patient's care.  The above  documentation reflects my direct findings during this shared patient visit. Please see the separate note by Dr. Lisbeth Renshaw on this date for the remainder of the patient's plan of care.    Carola Rhine, PAC

## 2020-01-12 ENCOUNTER — Telehealth: Payer: Self-pay | Admitting: Radiation Oncology

## 2020-01-12 ENCOUNTER — Telehealth: Payer: Self-pay | Admitting: *Deleted

## 2020-01-12 NOTE — Telephone Encounter (Signed)
I called and spoke with the patient. She is in agreement to proceed with SBRT and is scheduled for simulation later this week. We will proceed with 3-5 fractions depending on her planning.

## 2020-01-12 NOTE — Telephone Encounter (Signed)
Oncology Nurse Navigator Documentation  Oncology Nurse Navigator Flowsheets 01/12/2020  Abnormal Finding Date 12/21/2019  Confirmed Diagnosis Date 12/29/2019  Diagnosis Status Confirmed Diagnosis Complete  Planned Course of Treatment Radiation  Phase of Treatment Radiation  Radiation Pending-Reason: Patient Request/Initiated  Radiation Actual Start Date: 01/26/2020  Radiation Expected End Date: 02/02/2020  Navigator Follow Up Date: 01/12/2020  Navigator Follow Up Reason: Appointment Review  Navigator Location [facility]  Referral Date to RadOnc/MedOnc -  Navigator Encounter Type Clinic/MDC;Telephone  Telephone Outgoing Call  [facility] Date 01/07/2020  Multidisiplinary Clinic Type Thoracic  Patient Visit Type MedOnc/I called patient to follow up from thoracic clinic.  She has decided to get rad onc tx.  She schedule is set.  I listened as she explained her readiness for treatment.    Treatment Phase Pre-Tx/Tx Discussion  Barriers/Navigation Needs Education  Education Newly Diagnosed Cancer Education;Other  Interventions Education;Psycho-Social Support  Acuity Level 2-Minimal Needs (1-2 Barriers Identified)  Education Method Verbal;Written  Time Spent with Patient 12

## 2020-01-14 ENCOUNTER — Ambulatory Visit: Payer: Medicare Other | Admitting: Radiation Oncology

## 2020-01-15 ENCOUNTER — Ambulatory Visit
Admission: RE | Admit: 2020-01-15 | Discharge: 2020-01-15 | Disposition: A | Discharge status: Home / Self Care | Payer: Medicare Other | Source: Ambulatory Visit | Attending: Radiation Oncology | Admitting: Radiation Oncology

## 2020-01-15 DIAGNOSIS — C3490 Malignant neoplasm of unspecified part of unspecified bronchus or lung: Secondary | ICD-10-CM | POA: Insufficient documentation

## 2020-01-20 ENCOUNTER — Ambulatory Visit (INDEPENDENT_AMBULATORY_CARE_PROVIDER_SITE_OTHER): Payer: Medicare Other | Admitting: Critical Care Medicine

## 2020-01-20 ENCOUNTER — Encounter: Payer: Self-pay | Admitting: Critical Care Medicine

## 2020-01-20 ENCOUNTER — Other Ambulatory Visit: Payer: Self-pay

## 2020-01-20 VITALS — BP 116/58 | HR 93 | Temp 98.3°F | Ht 65.0 in | Wt 140.4 lb

## 2020-01-20 DIAGNOSIS — C342 Malignant neoplasm of middle lobe, bronchus or lung: Secondary | ICD-10-CM

## 2020-01-20 DIAGNOSIS — J449 Chronic obstructive pulmonary disease, unspecified: Secondary | ICD-10-CM | POA: Diagnosis not present

## 2020-01-20 DIAGNOSIS — Z720 Tobacco use: Secondary | ICD-10-CM

## 2020-01-20 DIAGNOSIS — R05 Cough: Secondary | ICD-10-CM

## 2020-01-20 DIAGNOSIS — R053 Chronic cough: Secondary | ICD-10-CM

## 2020-01-20 MED ORDER — FLUTICASONE PROPIONATE 50 MCG/ACT NA SUSP: 2.0000 | Freq: Every day | NASAL | 11 refills | Status: AC

## 2020-01-20 MED ORDER — PROMETHAZINE HCL 6.25 MG/5ML PO SYRP
12.5000 mg | ORAL_SOLUTION | Freq: Three times a day (TID) | ORAL | 0 refills | Status: DC | PRN
Start: 2020-01-20 — End: 2020-10-17

## 2020-01-20 NOTE — Progress Notes (Signed)
Synopsis: Referred in August 2016 for emphysema by Bartholome Bill, MD.  Previously patient of Dr. Lake Bells.  Subjective:   PATIENT ID: Katrina Ball GENDER: female DOB: [birth year], MRN: [id number]  Chief Complaint  Patient presents with  . Follow-up    SOB at times, increased dry cough     Katrina Ball is a 74 y/o woman who presents for follow-up of COPD and recently diagnosed stage Ia RML cancer.  She starting SBRT on 01/26/2020 as she is not a surgical candidate due to severe COPD.  She continues on Anoro once daily.  She uses albuterol about twice daily for breakthrough symptoms, which is her baseline.  No change in cough, wheezing, shortness of breath.  She is down to smoking 2 cigarettes/day; she throws away about half of the cigarette.  She has had an ongoing cough for the last few weeks which she attributes to postnasal drip.  She continues to take Claritin and has tried [place] OTC nasal spray without significant benefit.  She wants to try codeine-based cough syrup, which helped her in the past.  She has some eye dryness for which she is followed up with ophthalmology.  She has lost about 3 pounds over the last 5 months which she attributes to changes in her diet.  She tends to eat differently during the summer and usually loses weight.  All of her clothes still fit and her appetite is normal.    OV 09/22/19: Katrina Ball is a 74 year old woman who presents for follow-up of COPD.  She has been taking her Anoro daily, and continues to have issues with her eyes.  She has not been using her eyedrops regularly as prescribed.  Notes from [facility] [place] from 07/01/2019 reviewed-dry eyes with punctate keratitis, mild cataracts, lid lag, recommended to take gel eyedrops.  She has ongoing sinus pressure and congestion, but has only been using Claritin intermittently.  She has improvement in her symptoms when she takes it, causing her to stop taking it.  Shortness of breath- varies  day-to-day.  Some days symptoms are great, some days she has trouble walking around the house.  Does not seem to correlate with when she has been taking Claritin.  She uses her albuterol about twice daily, with improvement of her symptoms.  She has cut down to 3 to 4 cigarettes/day, mostly because she does not work outside and smoking cold weather.  She wants to try nicotine gum.  Insurance did not cover Chantix.  She is interested in getting Covid vaccine.    OV 07/14/2019: Katrina Ball is a 74 year old woman who presents for follow-up of COPD.  She recently had exacerbation associated with sinusitis that resolved with ampicillin-her green mucus and sinus congestion have improved.  Due to eye symptoms, she was recently switched from Anoro to [place], but she does not feel that her breathing is as well controlled on Breo.  She describes her eyes as having been dry.  She saw [facility] last week and has a follow-up appointment in 6 months.  They did not recommend that she remain off of a LAMA.  She continues to have postnasal drip, and has been using Flonase, but has not been taking Claritin.  Her COPD symptoms are overall well controlled, she denies cough, sputum, and only has occasional wheezing.  She has dyspnea on exertion that limits her activity to walking about 1 block.  She continues to smoke 3 to 4 cigarettes/day, some days more if  she is having a bad day.  Her heavy smoking was 6 cigarettes/day x 35 years.  She is thinking about quitting but does not think she is quite ready.  She has never been hospitalized for COPD.    Past Medical History:  Diagnosis Date  . Anemia    "used to be" (12/23/2012)  . Anxiety   . Arrhythmia   . Arthritis   . Cataract   . Chest pain   . COPD (chronic obstructive pulmonary disease) ([place])   . Depression   . Exertional shortness of breath   . Fracture of tibial plateau, closed 12/23/2012  . GERD (gastroesophageal reflux disease)   . Heart murmur 2016   Dr.  Aundra Dubin did not hear a murmer  . Hypertension   . Numbness and tingling in hands   . Sleep apnea      Family History  Problem Relation Age of Onset  . CVA Mother   . Heart failure Mother   . Heart disease Mother   . Asthma Mother      Past Surgical History:  Procedure Laterality Date  . BRONCHIAL BIOPSY  12/29/2019   Procedure: BRONCHIAL BIOPSIES;  Surgeon: Collene Gobble, MD;  Location: [facility] ENDOSCOPY;  Service: Pulmonary;;  . BRONCHIAL BRUSHINGS  12/29/2019   Procedure: BRONCHIAL BRUSHINGS;  Surgeon: Collene Gobble, MD;  Location: [facility] ENDOSCOPY;  Service: Pulmonary;;  . BRONCHIAL NEEDLE ASPIRATION BIOPSY  12/29/2019   Procedure: BRONCHIAL NEEDLE ASPIRATION BIOPSIES;  Surgeon: Collene Gobble, MD;  Location: [facility] ENDOSCOPY;  Service: Pulmonary;;  . CARDIAC CATHETERIZATION N/A 02/17/2015   Procedure: Right Heart Cath;  Surgeon: Larey Dresser, MD;  Location: [facility] CV LAB;  Service: Cardiovascular;  Laterality: N/A;  . COLONOSCOPY    . FIDUCIAL MARKER PLACEMENT  12/29/2019   Procedure: FIDUCIAL MARKER PLACEMENT;  Surgeon: Collene Gobble, MD;  Location: [facility] ENDOSCOPY;  Service: Pulmonary;;  . INCISION AND DRAINAGE OF WOUND  12/07/2012   "LLE" (12/23/2012)  . ORIF PROXIMAL TIBIAL PLATEAU FRACTURE Left 12/23/2012  . ORIF TIBIA PLATEAU Left 12/23/2012   Procedure: OPEN REDUCTION INTERNAL FIXATION (ORIF) LEFT TIBIAL PLATEAU;  Surgeon: Johnny Bridge, MD;  Location: [facility];  Service: Orthopedics;  Laterality: Left;  . TUBAL LIGATION    . VIDEO BRONCHOSCOPY WITH ENDOBRONCHIAL NAVIGATION N/A 12/29/2019   Procedure: VIDEO BRONCHOSCOPY WITH ENDOBRONCHIAL NAVIGATION;  Surgeon: Collene Gobble, MD;  Location: [facility] ENDOSCOPY;  Service: Pulmonary;  Laterality: N/A;    Social History   Socioeconomic History  . Marital status: Widowed    Spouse name: Not on file  . Number of children: 3  . Years of education: Not on file  . Highest education level: Not on file  Occupational History  . Occupation:  retired  Tobacco Use  . Smoking status: Current Every Day Smoker    Packs/day: 0.25    Years: 45.00    Pack years: 11.25    Types: Cigarettes  . Smokeless tobacco: Never Used  . Tobacco comment: 2 cigarettes a day 01/20/20 [facility]  Substance and Sexual Activity  . Alcohol use: Not Currently    Comment: 12/23/2012 "alcohol intake varys greatly" 12/28/2019- not  rare  . Drug use: No  . Sexual activity: Never  Other Topics Concern  . Not on file  Social History Narrative  . Not on file   Social Determinants of Health   Financial Resource Strain:   . Difficulty of Paying Living Expenses:   Food Insecurity:   .  Worried About Charity fundraiser in the Last Year:   . Arboriculturist in the Last Year:   Transportation Needs:   . Film/video editor (Medical):   Marland Kitchen Lack of Transportation (Non-Medical):   Physical Activity:   . Days of Exercise per Week:   . Minutes of Exercise per Session:   Stress:   . Feeling of Stress :   Social Connections:   . Frequency of Communication with Friends and Family:   . Frequency of Social Gatherings with Friends and Family:   . Attends Religious Services:   . Active Member of Clubs or Organizations:   . Attends Archivist Meetings:   Marland Kitchen Marital Status:   Intimate Partner Violence:   . Fear of Current or Ex-Partner:   . Emotionally Abused:   Marland Kitchen Physically Abused:   . Sexually Abused:      No Known Allergies   Immunization History  Administered Date(s) Administered  . Influenza Split 05/29/2016  . Influenza, High Dose Seasonal PF 05/08/2019  . Influenza,inj,Quad PF,6+ Mos 05/31/2015  . Influenza-Unspecified 05/29/2016  . PFIZER SARS-COV-2 Vaccination 10/15/2019, 11/10/2019  . Pneumococcal Conjugate-13 12/15/2015  . Pneumococcal Polysaccharide-23 10/05/2013    Outpatient Medications Prior to Visit  Medication Sig Dispense Refill  . albuterol ([name] Inhale 2 puffs into the lungs every 4 (four)  hours as needed for wheezing or shortness of breath. 18 g 5  . Carboxymethylcellul-Glycerin (LUBRICATING EYE DROPS OP) Place 1 drop into both eyes 2 (two) times daily as needed (dry eyes).    Marland Kitchen FLUoxetine (PROZAC) 20 MG capsule Take 60 mg by mouth daily.    Marland Kitchen loratadine (CLARITIN) 10 MG tablet Take 10 mg by mouth daily.    . Magnesium 400 MG TABS Take 400 mg by mouth daily.    . naproxen sodium (ALEVE) 220 MG tablet Take 220 mg by mouth daily as needed (pain).    . nicotine polacrilex (NICORETTE) 4 MG gum Take 4 mg by mouth as needed for smoking cessation.    Marland Kitchen olmesartan-hydrochlorothiazide (BENICAR HCT) 40-25 MG tablet Take 1 tablet by mouth daily.    . Psyllium (METAMUCIL PO) Take 1 Dose by mouth daily as needed (constipation).    Marland Kitchen umeclidinium-vilanterol (ANORO ELLIPTA) 62.5-25 MCG/INH AEPB Inhale 1 puff into the lungs daily. 1 each 11   No facility-administered medications prior to visit.    Review of Systems  Constitutional: Negative for chills and fever.  HENT: Positive for congestion.        Postnasal drip  Eyes:       Dry eyes resolved  Respiratory: Positive for shortness of breath. Negative for cough, sputum production and wheezing.   Cardiovascular: Negative for chest pain and leg swelling.  Gastrointestinal:       Occasional central abdominal pain, no n/v  Musculoskeletal: Negative.   Skin: Negative.   Neurological: Negative.      Objective:   Vitals:   01/20/20 1525  BP: (!) 116/58  Pulse: 93  Temp: 98.3 F (36.8 C)  TempSrc: Oral  SpO2: 98%  Weight: 140 lb 6.4 oz (63.7 kg)  Height: 5\' 5"  (1.651 m)   98% on   RA BMI Readings from Last 3 Encounters:  01/20/20 23.36 kg/m  01/07/20 23.46 kg/m  12/28/19 23.30 kg/m   Wt Readings from Last 3 Encounters:  01/20/20 140 lb 6.4 oz (63.7 kg)  01/07/20 141 lb (64 kg)  12/28/19 140 lb (63.5  kg)    Physical Exam Vitals reviewed.  HENT:     Head: Normocephalic and atraumatic.  Eyes:     General: No  scleral icterus. Cardiovascular:     Rate and Rhythm: Normal rate and regular rhythm.     Heart sounds: No murmur.  Pulmonary:     Comments: CTAB, breathing comfortably on RA Abdominal:     General: There is no distension.     Palpations: Abdomen is soft.     Tenderness: There is no abdominal tenderness.  Musculoskeletal:        General: No swelling or deformity.     Cervical back: Neck supple.  Lymphadenopathy:     Cervical: No cervical adenopathy.  Skin:    General: Skin is warm and dry.     Findings: No rash.  Neurological:     General: No focal deficit present.     Mental Status: She is alert.     Coordination: Coordination normal.  Psychiatric:        Mood and Affect: Mood normal.        Behavior: Behavior normal.      Chest Imaging- films reviewed: CTA chest 01/13/2015-centrilobular emphysema, airway thickening, linear scar in RML.  Nodule in lateral RML.  Linear scars in bilateral lower lobes.  Posterior diaphragmatic eventration on the left.  No significant mediastinal or hilar adenopathy.  Pulmonary Functions Testing Results: PFT Results Latest Ref Rng & Units 02/16/2015  FVC-Pre L 1.70  FVC-Predicted Pre % 72  FVC-Post L 1.66  FVC-Predicted Post % 70  Pre FEV1/FVC % % 51  Post FEV1/FCV % % 52  FEV1-Pre L 0.86  FEV1-Predicted Pre % 47  FEV1-Post L 0.87  DLCO UNC% % 37  DLCO COR %Predicted % 65  TLC L 4.70  TLC % Predicted % 95  RV % Predicted % 143   2016- Moderate obstruction, severe diffusion impairment   Echocardiogram 01/05/2015: LVEF 55 to 08%, diastolic dysfunction.  Moderate TR.  Normal LA, RV.  Elevated PASP.  Normal mitral and aortic valves.    Heart Catheterization:  RHC 02/17/2015: RHC (6/16) with mean RA 6, PA 42/24 mean 32, mean PCWP 16, CI 2.69, PVR 3.4 WU. 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 Malignant neoplasm of middle lobe  of right lung (HCC)  C34.2   2. Tobacco abuse  Z72.0   3. Chronic obstructive pulmonary disease, unspecified COPD type ([place])  J44.9   4. Chronic cough  R05      COPD, GOLD C-stable -Continue Anoro once daily -Continue eyedrops per ophthalmology and follow-up as prescribed -Continue albuterol as needed -Congratulated her on her quitting efforts -UTD on covid, flu, pneumonia vaccines.   Tobacco abuse; working on cutting down [employer] her on her ongoing quitting efforts and encouraged her to fully quit  Allergic rhinitis causing chronic cough -Continue Claritin daily  -Restart Flonase 2 sprays bilaterally once daily -Okay to use Phenergan cough syrup as needed; instructed to avoid driving when using this.  Recommend against chronic codeine-based cough syrup use, especially with chronic constipation history.  RML stage IA NSCLC -Planning for SBRT per radiation oncology   RTC in 3 months.   Current Outpatient Medications:  .  albuterol ([name], Inhale 2 puffs into the lungs every 4 (four) hours as needed for wheezing or shortness  of breath., Disp: 18 g, Rfl: 5 .  Carboxymethylcellul-Glycerin (LUBRICATING EYE DROPS OP), Place 1 drop into both eyes 2 (two) times daily as needed (dry eyes)., Disp: , Rfl:  .  FLUoxetine (PROZAC) 20 MG capsule, Take 60 mg by mouth daily., Disp: , Rfl:  .  loratadine (CLARITIN) 10 MG tablet, Take 10 mg by mouth daily., Disp: , Rfl:  .  Magnesium 400 MG TABS, Take 400 mg by mouth daily., Disp: , Rfl:  .  naproxen sodium (ALEVE) 220 MG tablet, Take 220 mg by mouth daily as needed (pain)., Disp: , Rfl:  .  nicotine polacrilex (NICORETTE) 4 MG gum, Take 4 mg by mouth as needed for smoking cessation., Disp: , Rfl:  .  olmesartan-hydrochlorothiazide (BENICAR HCT) 40-25 MG tablet, Take 1 tablet by mouth daily., Disp: , Rfl:  .  Psyllium (METAMUCIL PO), Take 1 Dose by mouth daily as needed (constipation)., Disp: , Rfl:   .  umeclidinium-vilanterol (ANORO ELLIPTA) 62.5-25 MCG/INH AEPB, Inhale 1 puff into the lungs daily., Disp: 1 each, Rfl: 11 .  fluticasone (FLONASE) 50 MCG/ACT nasal spray, Place 2 sprays into both nostrils daily., Disp: 16 g, Rfl: 11 .  promethazine (PHENERGAN) 6.25 MG/5ML syrup, Take 10 mLs (12.5 mg total) by mouth every 8 (eight) hours as needed (cough). No driving when taking, Disp: 240 mL, Rfl: 0   Julian Hy, DO [facility] 01/20/2020 4:05 PM

## 2020-01-20 NOTE — Patient Instructions (Addendum)
Thank you for visiting Dr. Carlis Abbott at [facility]. We recommend the following:  Keep taking Anoro once daily. Keep using albuterol as needed.  Meds ordered this encounter  Medications  . fluticasone (FLONASE) 50 MCG/ACT nasal spray    Sig: Place 2 sprays into both nostrils daily.    Dispense:  16 g    Refill:  11  . promethazine (PHENERGAN) 6.25 MG/5ML syrup    Sig: Take 10 mLs (12.5 mg total) by mouth every 8 (eight) hours as needed (cough). No driving when taking    Dispense:  240 mL    Refill:  0    Return in about 4 months (around 05/21/2020).    Please do your part to reduce the spread of COVID-19.

## 2020-01-26 ENCOUNTER — Other Ambulatory Visit: Payer: Self-pay

## 2020-01-26 ENCOUNTER — Ambulatory Visit
Admission: RE | Admit: 2020-01-26 | Discharge: 2020-01-26 | Disposition: A | Discharge status: Home / Self Care | Payer: Medicare Other | Source: Ambulatory Visit | Attending: Radiation Oncology | Admitting: Radiation Oncology

## 2020-01-26 DIAGNOSIS — C3490 Malignant neoplasm of unspecified part of unspecified bronchus or lung: Secondary | ICD-10-CM | POA: Diagnosis present

## 2020-01-28 ENCOUNTER — Ambulatory Visit
Admission: RE | Admit: 2020-01-28 | Discharge: 2020-01-28 | Disposition: A | Discharge status: Home / Self Care | Payer: Medicare Other | Source: Ambulatory Visit | Attending: Radiation Oncology | Admitting: Radiation Oncology

## 2020-01-28 ENCOUNTER — Other Ambulatory Visit: Payer: Self-pay

## 2020-01-28 DIAGNOSIS — C3490 Malignant neoplasm of unspecified part of unspecified bronchus or lung: Secondary | ICD-10-CM | POA: Diagnosis not present

## 2020-02-02 ENCOUNTER — Ambulatory Visit
Admission: RE | Admit: 2020-02-02 | Discharge: 2020-02-02 | Disposition: A | Discharge status: Home / Self Care | Payer: Medicare Other | Source: Ambulatory Visit | Attending: Radiation Oncology | Admitting: Radiation Oncology

## 2020-02-02 ENCOUNTER — Other Ambulatory Visit: Payer: Self-pay

## 2020-02-02 ENCOUNTER — Encounter: Payer: Self-pay | Admitting: Radiation Oncology

## 2020-02-02 DIAGNOSIS — C3490 Malignant neoplasm of unspecified part of unspecified bronchus or lung: Secondary | ICD-10-CM | POA: Diagnosis not present

## 2020-03-16 ENCOUNTER — Telehealth: Payer: Self-pay | Admitting: *Deleted

## 2020-03-16 ENCOUNTER — Telehealth: Payer: Self-pay | Admitting: Radiation Oncology

## 2020-03-16 DIAGNOSIS — C342 Malignant neoplasm of middle lobe, bronchus or lung: Secondary | ICD-10-CM

## 2020-03-16 NOTE — Telephone Encounter (Signed)
CALLED PATIENT TO INFORM OF STAT LABS ON 03-28-20 @ 1:15 PM @ [facility] AND HER CT TO FOLLOW ON 03-28-20 - ARRIVAL TIME- 2:15 PM @ [facility], PATIENT TO HAVE WATER ONLY - 4 HRS. PRIOR TO TEST, PATIENT TO RECEIVE RESULTS FROM [facility] PERKINS ON 03-29-20 @ 8:30 AM, VIA TELEPHONE, SPOKE WITH PATIENT AND SHE VERIFIED UNDERSTANDING THESE APPTS.

## 2020-03-16 NOTE — Telephone Encounter (Signed)
  Radiation Oncology         [phone] ________________________________  Name: Katrina Ball MRN: [id number]  Date of Service: 03/16/2020  DOB: [birth year]  Post Treatment Telephone Note  Diagnosis:   Stage IA2, cT1bN0M0, NSCLC of the right middle lobe.  Interval Since Last Radiation:  6 weeks   01/26/20-02/02/20 SBRT Treatment: The RML target was treated to 54 Gy in 3 fractions.  Narrative:  The patient was contacted today for routine follow-up. During treatment she did very well with radiotherapy and did not have significant desquamation.   Impression/Plan: 1. Stage IA2, cT1bN0M0, NSCLC of the right middle lobe. The patient has been doing well since completion of radiotherapy. I could not reach her today so I left a voicemail and on it I discussed that we would plan to repeat a CT chest in the next few weeks and follow up with these results. She would then begin surveillance with CT imaging at 6 month intervals.     Carola Rhine, PAC

## 2020-03-26 NOTE — Progress Notes (Signed)
  Radiation Oncology         [phone]) [phone] ________________________________  Name: Katrina Ball MRN: [id number]  Date: 02/02/2020  DOB: 1946-02-23  End of Treatment Note  Diagnosis:   Lung cancer    Indication for treatment::  curative       Radiation treatment dates:   01/26/20 - 02/02/20  Site/dose:   The patient was treated to the right lung with a course of stereotactic body radiation treatment.  The patient received 54 Gray in 3 fractions using a SBRT/VMAT technique, with 3 fields.  Narrative: The patient tolerated radiation treatment relatively well.   No unexpected difficulties.  The patient's breathing did not significantly change during the course of the treatment.  Plan: The patient has completed radiation treatment. The patient will return to radiation oncology clinic for routine followup in one month. I advised the patient to call or return sooner if they have any questions or concerns related to their recovery or treatment. ________________________________  Jodelle Gross, M.D., Ph.D.

## 2020-03-28 ENCOUNTER — Ambulatory Visit
Admission: RE | Admit: 2020-03-28 | Discharge: 2020-03-28 | Disposition: A | Discharge status: Home / Self Care | Payer: Medicare Other | Source: Ambulatory Visit | Attending: Radiation Oncology | Admitting: Radiation Oncology

## 2020-03-28 ENCOUNTER — Other Ambulatory Visit: Payer: Self-pay

## 2020-03-28 ENCOUNTER — Ambulatory Visit (HOSPITAL_COMMUNITY)
Admission: RE | Admit: 2020-03-28 | Discharge: 2020-03-28 | Disposition: A | Discharge status: Home / Self Care | Payer: Medicare Other | Source: Ambulatory Visit | Attending: Radiation Oncology | Admitting: Radiation Oncology

## 2020-03-28 ENCOUNTER — Encounter (HOSPITAL_COMMUNITY): Payer: Self-pay

## 2020-03-28 DIAGNOSIS — C342 Malignant neoplasm of middle lobe, bronchus or lung: Secondary | ICD-10-CM

## 2020-03-28 HISTORY — DX: Malignant (primary) neoplasm, unspecified: C80.1

## 2020-03-28 LAB — BUN & CREATININE (CHCC)
BUN: 34 mg/dL — ABNORMAL HIGH (ref 8–23)
Creatinine: 1.61 mg/dL — ABNORMAL HIGH (ref 0.44–1.00)
GFR, Est AFR Am: 37 mL/min — ABNORMAL LOW (ref >60)
GFR, Estimated: 32 mL/min — ABNORMAL LOW (ref >60)

## 2020-03-28 MED ORDER — IOHEXOL 300 MG/ML  SOLN
75.0000 mL | Freq: Once | INTRAMUSCULAR | Status: AC | PRN
  Administered 2020-03-28: 60 mL via INTRAVENOUS

## 2020-03-28 MED ORDER — SODIUM CHLORIDE (PF) 0.9 % IJ SOLN
INTRAMUSCULAR | Status: AC
  Filled 2020-03-28: qty 50

## 2020-03-29 ENCOUNTER — Ambulatory Visit
Admission: RE | Admit: 2020-03-29 | Discharge: 2020-03-29 | Disposition: A | Discharge status: Home / Self Care | Payer: Medicare Other | Source: Ambulatory Visit | Attending: Radiation Oncology | Admitting: Radiation Oncology

## 2020-03-29 DIAGNOSIS — C342 Malignant neoplasm of middle lobe, bronchus or lung: Secondary | ICD-10-CM

## 2020-03-29 NOTE — Progress Notes (Signed)
Radiation Oncology         [phone] ________________________________  Name: Katrina Ball MRN: [id number]  Date of Service: 03/29/2020  DOB: 1945/09/03  Post Treatment Telephone Note  Diagnosis:   Stage IA2, cT1bN0M0, NSCLC of the right middle lobe.  Interval Since Last Radiation:  6 weeks   01/26/20-02/02/20 SBRT Treatment: The RML target was treated to 54 Gy in 3 fractions.  Narrative:  The patient was contacted today for routine follow-up. Her post treatment CT chest yesterday showed improvement in the lesion in the RML without adenopathy, or new nodules. She does continue to have stigmata of emphysematous and atherosclerotic vascular findings. No evidence of pneumonitis was noted.  On review of systems, the patient reports that she is doing well overall. She continues to have a dry frequent cough. denies any chest pain, shortness of breath, fevers, chills, night sweats, unintended weight changes. She denies any bowel or bladder disturbances, and denies abdominal pain, nausea or vomiting. She denies any new musculoskeletal or joint aches or pains, new skin lesions or concerns. A complete review of systems is obtained and is otherwise negative.  PAST MEDICAL HISTORY:  Past Medical History:  Diagnosis Date  . Anemia    "used to be" (12/23/2012)  . Anxiety   . Arrhythmia   . Arthritis   . Cataract   . Chest pain   . COPD (chronic obstructive pulmonary disease) ([place])   . Depression   . Exertional shortness of breath   . Fracture of tibial plateau, closed 12/23/2012  . GERD (gastroesophageal reflux disease)   . Heart murmur 2016   Dr. Aundra Dubin did not hear a murmer  . Hypertension   . lung ca dx'd 01/2020  . Numbness and tingling in hands   . Sleep apnea     PAST SURGICAL HISTORY: Past Surgical History:  Procedure Laterality Date  . BRONCHIAL BIOPSY  12/29/2019   Procedure: BRONCHIAL BIOPSIES;  Surgeon: Collene Gobble, MD;  Location: [facility] ENDOSCOPY;  Service: Pulmonary;;  .  BRONCHIAL BRUSHINGS  12/29/2019   Procedure: BRONCHIAL BRUSHINGS;  Surgeon: Collene Gobble, MD;  Location: [facility] ENDOSCOPY;  Service: Pulmonary;;  . BRONCHIAL NEEDLE ASPIRATION BIOPSY  12/29/2019   Procedure: BRONCHIAL NEEDLE ASPIRATION BIOPSIES;  Surgeon: Collene Gobble, MD;  Location: [place] ENDOSCOPY;  Service: Pulmonary;;  . CARDIAC CATHETERIZATION N/A 02/17/2015   Procedure: Right Heart Cath;  Surgeon: Larey Dresser, MD;  Location: [place] CV LAB;  Service: Cardiovascular;  Laterality: N/A;  . COLONOSCOPY    . FIDUCIAL MARKER PLACEMENT  12/29/2019   Procedure: FIDUCIAL MARKER PLACEMENT;  Surgeon: Collene Gobble, MD;  Location: [facility] ENDOSCOPY;  Service: Pulmonary;;  . INCISION AND DRAINAGE OF WOUND  12/07/2012   "LLE" (12/23/2012)  . ORIF PROXIMAL TIBIAL PLATEAU FRACTURE Left 12/23/2012  . ORIF TIBIA PLATEAU Left 12/23/2012   Procedure: OPEN REDUCTION INTERNAL FIXATION (ORIF) LEFT TIBIAL PLATEAU;  Surgeon: Johnny Bridge, MD;  Location: [place];  Service: Orthopedics;  Laterality: Left;  . TUBAL LIGATION    . VIDEO BRONCHOSCOPY WITH ENDOBRONCHIAL NAVIGATION N/A 12/29/2019   Procedure: VIDEO BRONCHOSCOPY WITH ENDOBRONCHIAL NAVIGATION;  Surgeon: Collene Gobble, MD;  Location: [place] ENDOSCOPY;  Service: Pulmonary;  Laterality: N/A;    PAST SOCIAL HISTORY:  Social History   Socioeconomic History  . Marital status: Widowed    Spouse name: Not on file  . Number of children: 3  . Years of education: Not on file  . Highest education level: Not  on file  Occupational History  . Occupation: retired  Tobacco Use  . Smoking status: Current Every Day Smoker    Packs/day: 0.25    Years: 45.00    Pack years: 11.25    Types: Cigarettes  . Smokeless tobacco: Never Used  . Tobacco comment: 2 cigarettes a day 01/20/20 [facility]  Vaping Use  . Vaping Use: Never used  Substance and Sexual Activity  . Alcohol use: Not Currently    Comment: 12/23/2012 "alcohol intake varys greatly" 12/28/2019- not  rare  . Drug use: No   . Sexual activity: Never  Other Topics Concern  . Not on file  Social History Narrative  . Not on file   Social Determinants of Health   Financial Resource Strain:   . Difficulty of Paying Living Expenses:   Food Insecurity:   . Worried About Charity fundraiser in the Last Year:   . Arboriculturist in the Last Year:   Transportation Needs:   . Film/video editor (Medical):   Marland Kitchen Lack of Transportation (Non-Medical):   Physical Activity:   . Days of Exercise per Week:   . Minutes of Exercise per Session:   Stress:   . Feeling of Stress :   Social Connections:   . Frequency of Communication with Friends and Family:   . Frequency of Social Gatherings with Friends and Family:   . Attends Religious Services:   . Active Member of Clubs or Organizations:   . Attends Archivist Meetings:   Marland Kitchen Marital Status:   Intimate Partner Violence:   . Fear of Current or Ex-Partner:   . Emotionally Abused:   Marland Kitchen Physically Abused:   . Sexually Abused:     PAST FAMILY HISTORY: Family History  Problem Relation Age of Onset  . CVA Mother   . Heart failure Mother   . Heart disease Mother   . Asthma Mother     MEDICATIONS  Current Outpatient Medications  Medication Sig Dispense Refill  . albuterol ([name] Inhale 2 puffs into the lungs every 4 (four) hours as needed for wheezing or shortness of breath. 18 g 5  . Carboxymethylcellul-Glycerin (LUBRICATING EYE DROPS OP) Place 1 drop into both eyes 2 (two) times daily as needed (dry eyes).    Marland Kitchen FLUoxetine (PROZAC) 20 MG capsule Take 60 mg by mouth daily.    . fluticasone (FLONASE) 50 MCG/ACT nasal spray Place 2 sprays into both nostrils daily. 16 g 11  . loratadine (CLARITIN) 10 MG tablet Take 10 mg by mouth daily.    . Magnesium 400 MG TABS Take 400 mg by mouth daily.    . naproxen sodium (ALEVE) 220 MG tablet Take 220 mg by mouth daily as needed (pain).    . nicotine polacrilex (NICORETTE) 4  MG gum Take 4 mg by mouth as needed for smoking cessation.    Marland Kitchen olmesartan-hydrochlorothiazide (BENICAR HCT) 40-25 MG tablet Take 1 tablet by mouth daily.    . promethazine (PHENERGAN) 6.25 MG/5ML syrup Take 10 mLs (12.5 mg total) by mouth every 8 (eight) hours as needed (cough). No driving when taking 299 mL 0  . Psyllium (METAMUCIL PO) Take 1 Dose by mouth daily as needed (constipation).    Marland Kitchen umeclidinium-vilanterol (ANORO ELLIPTA) 62.5-25 MCG/INH AEPB Inhale 1 puff into the lungs daily. 1 each 11   No current facility-administered medications for this visit.    ALLERGIES: No Known Allergies   Impression/Plan: 1.  Stage IA2, cT1bN0M0, NSCLC of the right middle lobe. The patient is doing well and today we reviewed her results from her CT scan. We will now move toward surveillance with CT imaging at 6 month intervals per NCCN guidelines. She is in agreement with this plan.  2. Persistent cough. I have given her parameters to call me if symptoms progressed such as in the case of pneumonitis, however I suggested she touch base with pulmonary medicine and have copied Dr. Carlis Abbott as well.     Carola Rhine, PAC

## 2020-05-30 ENCOUNTER — Telehealth: Payer: Self-pay | Admitting: Radiation Oncology

## 2020-05-30 NOTE — Telephone Encounter (Signed)
Called pt in regards to medical records req for her insurance company. No answer, LVM for a return call.

## 2020-06-01 ENCOUNTER — Telehealth: Payer: Self-pay | Admitting: Critical Care Medicine

## 2020-06-01 NOTE — Telephone Encounter (Signed)
Spoke with the pt  She states that she needs [place] to send info to her "cancer insurance"- [employer], that she was dx with lung cancer  She has been submitting information to them and her claim is being denied  Faxed copy of her cytology and path report to her ins at [phone]

## 2020-09-26 ENCOUNTER — Telehealth: Payer: Self-pay | Admitting: Critical Care Medicine

## 2020-09-26 ENCOUNTER — Encounter: Payer: Self-pay | Admitting: Radiation Oncology

## 2020-09-26 ENCOUNTER — Telehealth: Payer: Self-pay

## 2020-09-26 ENCOUNTER — Other Ambulatory Visit: Payer: Self-pay | Admitting: Critical Care Medicine

## 2020-09-26 DIAGNOSIS — R911 Solitary pulmonary nodule: Secondary | ICD-10-CM

## 2020-09-26 NOTE — Telephone Encounter (Signed)
Spoke with patient. She stated that the albuterol she had used last month did not seem like it was the same one she had been using the previous months. She called the pharmacy to see if they had changed manufacturers but the pharmacy told her that they had not. She picked up her refill this morning and received the same albuterol inhaler she had received back in December and November. She has not noticed any problems.   Advised her to call us back if she has any problems. She verbalized understanding.   Nothing further needed at time of call.

## 2020-09-26 NOTE — Telephone Encounter (Signed)
Spoke with patient in regards to telephone follow-up with Shona Simpson PA on 10/03/20 @ 1:00pm. Patient verbalized understanding of appointment date and time. Meaningful ise questions were reviewed. TM

## 2020-09-27 ENCOUNTER — Telehealth: Payer: Self-pay | Admitting: *Deleted

## 2020-09-27 NOTE — Telephone Encounter (Signed)
CALLED PATIENT TO ASK ABOUT COMING IN TOMORROW FOR STAT LABS @ [place], FOR CT, PATIENT AGREED TO COME IN @ 10:30 AM

## 2020-09-28 ENCOUNTER — Ambulatory Visit
Admission: RE | Admit: 2020-09-28 | Discharge: 2020-09-28 | Disposition: A | Discharge status: Home / Self Care | Payer: Medicare HMO | Source: Ambulatory Visit | Attending: Radiation Oncology | Admitting: Radiation Oncology

## 2020-09-28 ENCOUNTER — Other Ambulatory Visit: Payer: Self-pay

## 2020-09-28 ENCOUNTER — Ambulatory Visit (HOSPITAL_COMMUNITY)
Admission: RE | Admit: 2020-09-28 | Discharge: 2020-09-28 | Disposition: A | Discharge status: Home / Self Care | Payer: Medicare HMO | Source: Ambulatory Visit | Attending: Radiation Oncology | Admitting: Radiation Oncology

## 2020-09-28 DIAGNOSIS — C342 Malignant neoplasm of middle lobe, bronchus or lung: Secondary | ICD-10-CM

## 2020-09-28 DIAGNOSIS — J449 Chronic obstructive pulmonary disease, unspecified: Secondary | ICD-10-CM

## 2020-09-28 DIAGNOSIS — I272 Pulmonary hypertension, unspecified: Secondary | ICD-10-CM

## 2020-09-28 LAB — BUN & CREATININE (CHCC)
BUN: 23 mg/dL (ref 8–23)
Creatinine: 1.37 mg/dL — ABNORMAL HIGH (ref 0.44–1.00)
GFR, Estimated: 41 mL/min — ABNORMAL LOW (ref >60)

## 2020-09-28 MED ORDER — IOHEXOL 300 MG/ML  SOLN
75.0000 mL | Freq: Once | INTRAMUSCULAR | Status: AC | PRN
  Administered 2020-09-28: 60 mL via INTRAVENOUS

## 2020-09-28 NOTE — Progress Notes (Signed)
Radiation Oncology         [phone] ________________________________  Outpatient Follow Up - Conducted via telephone due to current COVID-19 concerns for limiting patient exposure  I spoke with the patient to conduct this consult visit via telephone to spare the patient unnecessary potential exposure in the healthcare setting during the current COVID-19 pandemic. The patient was notified in advance and was offered a [place] meeting to allow for face to face communication but unfortunately reported that they did not have the appropriate resources/technology to support such a visit and instead preferred to proceed with a telephone visit.    Name: Katrina Ball        MRN: [id number]  Date of Service: 09/28/2020 DOB: 08-Apr-1946  SN:KNLZ, Dola Factor, MD  Bartholome Bill, MD     REFERRING PHYSICIAN: Dr. Lamonte Sakai  DIAGNOSIS: The primary encounter diagnosis was Malignant neoplasm of middle lobe of right lung ([place]). Diagnoses of Chronic obstructive pulmonary disease, unspecified COPD type ([place]) and Pulmonary hypertension ([place]) were also pertinent to this visit.   HISTORY OF PRESENT ILLNESS: Katrina Ball is a 75 y.o. female with a history of Stage IA2, cT1bN0M0, NSCLC of the right middle lobe diagnosed with bronchoscopy with Dr. Lamonte Sakai on 12/29/2019, and final results of the brushing and fine-needle aspirate of the right middle lobe lesion were consistent with malignancy, representing non-small cell lung cancer.  The biopsy of the right middle lobe lung specimen was benign lung parenchyma.  There was no additional tissue to send categorize her pathology or for cell block.  Of note fiducial markers were placed at the time of her procedure. She elected to forgo surgery and rather proceeded with stereotactic body radiotherapy (SBRT) which she completed in June 2021. She's been followed in surveillance since and had a CT chest performed today which showed anticipated post radiation related changes  without concerns for active or new disease. She is scheduled to follow up with pulmonary medicine and will now establish with Dr. Loanne Drilling when she's seen in a few weeks for her history of COPD and pulmonary hypertension. She's contacted by phone today to review her CT scan.   PREVIOUS RADIATION THERAPY:  01/26/20-02/02/20 SBRT Treatment: The RML target was treated to 54 Gy in 3 fractions.   PAST MEDICAL HISTORY:  Past Medical History:  Diagnosis Date  . Anemia    "used to be" (12/23/2012)  . Anxiety   . Arrhythmia   . Arthritis   . Cataract   . Chest pain   . COPD (chronic obstructive pulmonary disease) ([place])   . Depression   . Exertional shortness of breath   . Fracture of tibial plateau, closed 12/23/2012  . GERD (gastroesophageal reflux disease)   . Heart murmur 2016   Dr. Aundra Dubin did not hear a murmer  . Hypertension   . lung ca dx'd 01/2020  . Numbness and tingling in hands   . Sleep apnea        PAST SURGICAL HISTORY: Past Surgical History:  Procedure Laterality Date  . BRONCHIAL BIOPSY  12/29/2019   Procedure: BRONCHIAL BIOPSIES;  Surgeon: Collene Gobble, MD;  Location: [facility] ENDOSCOPY;  Service: Pulmonary;;  . BRONCHIAL BRUSHINGS  12/29/2019   Procedure: BRONCHIAL BRUSHINGS;  Surgeon: Collene Gobble, MD;  Location: [facility] ENDOSCOPY;  Service: Pulmonary;;  . BRONCHIAL NEEDLE ASPIRATION BIOPSY  12/29/2019   Procedure: BRONCHIAL NEEDLE ASPIRATION BIOPSIES;  Surgeon: Collene Gobble, MD;  Location: [facility] ENDOSCOPY;  Service: Pulmonary;;  . CARDIAC CATHETERIZATION  N/A 02/17/2015   Procedure: Right Heart Cath;  Surgeon: Larey Dresser, MD;  Location: [place] CV LAB;  Service: Cardiovascular;  Laterality: N/A;  . COLONOSCOPY    . FIDUCIAL MARKER PLACEMENT  12/29/2019   Procedure: FIDUCIAL MARKER PLACEMENT;  Surgeon: Collene Gobble, MD;  Location: [facility] ENDOSCOPY;  Service: Pulmonary;;  . INCISION AND DRAINAGE OF WOUND  12/07/2012   "LLE" (12/23/2012)  . ORIF PROXIMAL TIBIAL PLATEAU  FRACTURE Left 12/23/2012  . ORIF TIBIA PLATEAU Left 12/23/2012   Procedure: OPEN REDUCTION INTERNAL FIXATION (ORIF) LEFT TIBIAL PLATEAU;  Surgeon: Johnny Bridge, MD;  Location: [place];  Service: Orthopedics;  Laterality: Left;  . TUBAL LIGATION    . VIDEO BRONCHOSCOPY WITH ENDOBRONCHIAL NAVIGATION N/A 12/29/2019   Procedure: VIDEO BRONCHOSCOPY WITH ENDOBRONCHIAL NAVIGATION;  Surgeon: Collene Gobble, MD;  Location: [place] ENDOSCOPY;  Service: Pulmonary;  Laterality: N/A;     FAMILY HISTORY:  Family History  Problem Relation Age of Onset  . CVA Mother   . Heart failure Mother   . Heart disease Mother   . Asthma Mother      SOCIAL HISTORY:  reports that she has been smoking cigarettes. She has a 11.25 pack-year smoking history. She has never used smokeless tobacco. She reports previous alcohol use. She reports that she does not use drugs. The patient is widowed and lives in [place].   ALLERGIES: Patient has no known allergies.   MEDICATIONS:  Current Outpatient Medications  Medication Sig Dispense Refill  . albuterol ([name] INHALE 2 PUFFS INTO THE LUNGS EVERY 4 (FOUR) HOURS AS NEEDED FOR WHEEZING OR SHORTNESS OF BREATH. 1 each 0  . ANORO ELLIPTA 62.5-25 MCG/INH AEPB TAKE 1 PUFF BY MOUTH EVERY DAY 60 each 3  . Carboxymethylcellul-Glycerin (LUBRICATING EYE DROPS OP) Place 1 drop into both eyes 2 (two) times daily as needed (dry eyes).    Marland Kitchen FLUoxetine (PROZAC) 20 MG capsule Take 60 mg by mouth daily.    . fluticasone (FLONASE) 50 MCG/ACT nasal spray Place 2 sprays into both nostrils daily. 16 g 11  . loratadine (CLARITIN) 10 MG tablet Take 10 mg by mouth daily.    . Magnesium 400 MG TABS Take 400 mg by mouth daily.    . naproxen sodium (ALEVE) 220 MG tablet Take 220 mg by mouth daily as needed (pain).    . nicotine polacrilex (NICORETTE) 4 MG gum Take 4 mg by mouth as needed for smoking cessation.    Marland Kitchen olmesartan-hydrochlorothiazide (BENICAR HCT) 40-25  MG tablet Take 1 tablet by mouth daily.    . predniSONE (DELTASONE) 20 MG tablet Take 20 mg by mouth daily.    . promethazine (PHENERGAN) 6.25 MG/5ML syrup Take 10 mLs (12.5 mg total) by mouth every 8 (eight) hours as needed (cough). No driving when taking 379 mL 0  . Psyllium (METAMUCIL PO) Take 1 Dose by mouth daily as needed (constipation).     No current facility-administered medications for this encounter.     REVIEW OF SYSTEMS: On review of systems, the patient reports that she is doing well overall. She continues to have shortness of breath with exertion but denies any trouble at rest. She also has some dry coughing, but denies chest pain or progressive difficulty breathing. No other complaints are verbalized.   PHYSICAL EXAM:  Unable to assess due to encounter type.    ECOG = 1  0 - Asymptomatic (Fully active, able to carry on all predisease  activities without restriction)  1 - Symptomatic but completely ambulatory (Restricted in physically strenuous activity but ambulatory and able to carry out work of a light or sedentary nature. For example, light housework, office work)  2 - Symptomatic, <50% in bed during the day (Ambulatory and capable of all self care but unable to carry out any work activities. Up and about more than 50% of waking hours)  3 - Symptomatic, >50% in bed, but not bedbound (Capable of only limited self-care, confined to bed or chair 50% or more of waking hours)  4 - Bedbound (Completely disabled. Cannot carry on any self-care. Totally confined to bed or chair)  5 - Death   Eustace Pen MM, Creech RH, Tormey DC, et al. [phone]). "Toxicity and response criteria of the [facility]". [place] Oncol. 5 (6): 649-55    LABORATORY DATA:  Lab Results  Component Value Date   WBC 8.2 12/29/2019   HGB 12.2 12/29/2019   HCT 39.2 12/29/2019   MCV 95.4 12/29/2019   PLT 346 12/29/2019   Lab Results  Component Value Date   NA 138 12/29/2019    K 5.1 12/29/2019   CL 103 12/29/2019   CO2 22 12/29/2019   Lab Results  Component Value Date   ALT 16 12/29/2019   AST 21 12/29/2019   ALKPHOS 74 12/29/2019   BILITOT 0.7 12/29/2019      RADIOGRAPHY: CT CHEST W CONTRAST  Result Date: 09/28/2020 CLINICAL DATA:  Restaging non-small cell lung cancer post radiation therapy. EXAM: CT CHEST WITH CONTRAST TECHNIQUE: Multidetector CT imaging of the chest was performed during intravenous contrast administration. CONTRAST:  79mL OMNIPAQUE IOHEXOL 300 MG/ML  SOLN COMPARISON:  Chest CT 03/28/2020.  PET-CT 01/04/2020. FINDINGS: Cardiovascular: Atherosclerosis of the aorta, great vessels and coronary arteries. No acute vascular findings. There is trace pericardial fluid versus thickening anteriorly. The heart size is normal. Mediastinum/Nodes: There are no enlarged mediastinal, hilar or axillary lymph nodes. The thyroid gland appears stable, without significant findings. The trachea and esophagus appear unremarkable. Lungs/Pleura: No pleural effusion or pneumothorax. Moderate centrilobular and paraseptal emphysema. Fiducial markers are present peripherally in the right middle lobe. The treated right middle lobe lesion appears smaller and less well-defined, measuring approximately 9 x 7 mm on image 83/7 (previously 12 x 11 mm). Scattered pulmonary scarring and tiny lung nodules are unchanged. No new or enlarging pulmonary nodules. Upper abdomen: The visualized upper abdomen appears stable without suspicious findings. Left-sided Bochdalek hernia containing fat noted. Musculoskeletal/Chest wall: There is no chest wall mass or suspicious osseous finding. IMPRESSION: 1. Interval decrease in size of treated right middle lobe lesion consistent with response to therapy. 2. No evidence of local recurrence or metastatic disease. 3. Aortic Atherosclerosis (ICD10-I70.0) and Emphysema (ICD10-J43.9). Electronically Signed   By: Richardean Sale M.D.   On: 09/28/2020 16:18        IMPRESSION/PLAN: 1. Stage IA2, cT1bN0M0, NSCLC of the right middle lobe. The patient has been doing well clinically and we reviewed her CT scan from today that does not show concerns for active disease, and no new concerns for disease. We discussed the plan to continue CT scans every 6 months per NCCN guidelines.  2. History of COPD and pulmonary hypertension. She will see Dr. Loanne Drilling in a few weeks to establish ongoing pulmonary care. We will follow her course expectantly.   Given current concerns for patient exposure during the COVID-19 pandemic, this encounter was conducted via telephone.  The patient has provided  two factor identification and has given verbal consent for this type of encounter and has been advised to only accept a meeting of this type in a secure network environment. The time spent during this encounter was 30 minutes including preparation, discussion, and coordination of the patient's care. The attendants for this meeting include  Hayden Pedro  and [place].  During the encounter, Hayden Pedro were located at [facility] Radiation Oncology Department.  Katrina Ball was located at home.    The above documentation reflects my direct findings during this shared patient visit. Please see the separate note by Dr. Lisbeth Renshaw on this date for the remainder of the patient's plan of care.    Carola Rhine, PAC

## 2020-10-03 ENCOUNTER — Inpatient Hospital Stay
Admission: RE | Admit: 2020-10-03 | Discharge: 2020-10-03 | Disposition: A | Discharge status: Home / Self Care | Payer: Self-pay | Source: Ambulatory Visit | Attending: Radiation Oncology | Admitting: Radiation Oncology

## 2020-10-17 ENCOUNTER — Other Ambulatory Visit: Payer: Self-pay

## 2020-10-17 ENCOUNTER — Ambulatory Visit: Payer: Medicare HMO | Admitting: Pulmonary Disease

## 2020-10-17 ENCOUNTER — Encounter: Payer: Self-pay | Admitting: Pulmonary Disease

## 2020-10-17 VITALS — BP 130/60 | HR 79 | Temp 97.1°F | Ht 60.0 in | Wt 146.8 lb

## 2020-10-17 DIAGNOSIS — Z720 Tobacco use: Secondary | ICD-10-CM

## 2020-10-17 DIAGNOSIS — J449 Chronic obstructive pulmonary disease, unspecified: Secondary | ICD-10-CM | POA: Diagnosis not present

## 2020-10-17 DIAGNOSIS — J3089 Other allergic rhinitis: Secondary | ICD-10-CM

## 2020-10-17 MED ORDER — PROMETHAZINE HCL 6.25 MG/5ML PO SYRP: 12.5000 mg | ORAL_SOLUTION | Freq: Three times a day (TID) | ORAL | 0 refills | Status: AC | PRN

## 2020-10-17 MED ORDER — TRELEGY ELLIPTA 100-62.5-25 MCG/INH IN AEPB: 1.0000 | INHALATION_SPRAY | Freq: Every day | RESPIRATORY_TRACT | 0 refills | Status: DC

## 2020-10-17 NOTE — Progress Notes (Signed)
Synopsis: Referred in August 2016 for emphysema by Bartholome Bill, MD.  Previously patient of Dr. Lake Bells.  Subjective:   PATIENT ID: Katrina Ball GENDER: female DOB: [birth year], MRN: [id number]  Chief Complaint  Patient presents with  . New Patient (Initial Visit)    Increased sob for the last months.      Ms. Katrina Ball is a 75 y/o woman who presents for follow-up of COPD and stage Ia RML cancer.  She starting SBRT on 01/26/2020 as she is not a surgical candidate due to severe COPD.  She continues on Anoro once daily.  She uses albuterol about twice daily for breakthrough symptoms, which is her baseline.    She is a former patient of Dr. Carlis Abbott. On review of prior records in EMR. She recently communicated with Dr. Carlis Abbott via telephone encounter on 09/26/20 regarding albuterol formula. She has been compliant with her Anoro. Has previously seen an Ophthalmologist for eye issues requiring drops with the last visit in 2021 with Dr. Shanon Rosser but currently not taking eye drops for dryness. She is an active smoker and smoking at least 2 cigarettes a day.   In the last month she has worsening shortness of breath and energy. Minimal cough. No wheezing. No recent fevers, chills. But sweating at times though not significant as night sweats.   Past Medical History:  Diagnosis Date  . Anemia    "used to be" (12/23/2012)  . Anxiety   . Arrhythmia   . Arthritis   . Cataract   . Chest pain   . COPD (chronic obstructive pulmonary disease) ([place])   . Depression   . Exertional shortness of breath   . Fracture of tibial plateau, closed 12/23/2012  . GERD (gastroesophageal reflux disease)   . Heart murmur 2016   Dr. Aundra Dubin did not hear a murmer  . Hypertension   . lung ca dx'd 01/2020  . Numbness and tingling in hands   . Sleep apnea      Family History  Problem Relation Age of Onset  . CVA Mother   . Heart failure Mother   . Heart disease Mother   . Asthma Mother      Past Surgical History:   Procedure Laterality Date  . BRONCHIAL BIOPSY  12/29/2019   Procedure: BRONCHIAL BIOPSIES;  Surgeon: Collene Gobble, MD;  Location: [facility] ENDOSCOPY;  Service: Pulmonary;;  . BRONCHIAL BRUSHINGS  12/29/2019   Procedure: BRONCHIAL BRUSHINGS;  Surgeon: Collene Gobble, MD;  Location: [facility] ENDOSCOPY;  Service: Pulmonary;;  . BRONCHIAL NEEDLE ASPIRATION BIOPSY  12/29/2019   Procedure: BRONCHIAL NEEDLE ASPIRATION BIOPSIES;  Surgeon: Collene Gobble, MD;  Location: [facility] ENDOSCOPY;  Service: Pulmonary;;  . CARDIAC CATHETERIZATION N/A 02/17/2015   Procedure: Right Heart Cath;  Surgeon: Larey Dresser, MD;  Location: [place] CV LAB;  Service: Cardiovascular;  Laterality: N/A;  . COLONOSCOPY    . FIDUCIAL MARKER PLACEMENT  12/29/2019   Procedure: FIDUCIAL MARKER PLACEMENT;  Surgeon: Collene Gobble, MD;  Location: [facility] ENDOSCOPY;  Service: Pulmonary;;  . INCISION AND DRAINAGE OF WOUND  12/07/2012   "LLE" (12/23/2012)  . ORIF PROXIMAL TIBIAL PLATEAU FRACTURE Left 12/23/2012  . ORIF TIBIA PLATEAU Left 12/23/2012   Procedure: OPEN REDUCTION INTERNAL FIXATION (ORIF) LEFT TIBIAL PLATEAU;  Surgeon: Johnny Bridge, MD;  Location: [place];  Service: Orthopedics;  Laterality: Left;  . TUBAL LIGATION    . VIDEO BRONCHOSCOPY WITH ENDOBRONCHIAL NAVIGATION N/A 12/29/2019   Procedure: VIDEO BRONCHOSCOPY WITH ENDOBRONCHIAL  NAVIGATION;  Surgeon: Collene Gobble, MD;  Location: [facility] ENDOSCOPY;  Service: Pulmonary;  Laterality: N/A;    Social History   Socioeconomic History  . Marital status: Widowed    Spouse name: Not on file  . Number of children: 3  . Years of education: Not on file  . Highest education level: Not on file  Occupational History  . Occupation: retired  Tobacco Use  . Smoking status: Current Every Day Smoker    Packs/day: 0.25    Years: 45.00    Pack years: 11.25    Types: Cigarettes  . Smokeless tobacco: Never Used  . Tobacco comment: 2 cigarettes a day 01/20/20 [facility]  Vaping Use  . Vaping Use: Never used   Substance and Sexual Activity  . Alcohol use: Not Currently    Comment: 12/23/2012 "alcohol intake varys greatly" 12/28/2019- not  rare  . Drug use: No  . Sexual activity: Never  Other Topics Concern  . Not on file  Social History Narrative  . Not on file   Social Determinants of Health   Financial Resource Strain: Not on file  Food Insecurity: Not on file  Transportation Needs: Not on file  Physical Activity: Not on file  Stress: Not on file  Social Connections: Not on file  Intimate Partner Violence: Not on file     No Known Allergies   Immunization History  Administered Date(s) Administered  . Influenza Split 05/29/2016  . Influenza, High Dose Seasonal PF 05/08/2019, 06/14/2020  . Influenza,inj,Quad PF,6+ Mos 05/31/2015  . Influenza-Unspecified 05/29/2016  . PFIZER(Purple Top)SARS-COV-2 Vaccination 10/15/2019, 11/10/2019  . Pneumococcal Conjugate-13 12/15/2015  . Pneumococcal Polysaccharide-23 10/05/2013    Outpatient Medications Prior to Visit  Medication Sig Dispense Refill  . albuterol ([name] INHALE 2 PUFFS INTO THE LUNGS EVERY 4 (FOUR) HOURS AS NEEDED FOR WHEEZING OR SHORTNESS OF BREATH. 1 each 0  . ANORO ELLIPTA 62.5-25 MCG/INH AEPB TAKE 1 PUFF BY MOUTH EVERY DAY 60 each 3  . Carboxymethylcellul-Glycerin (LUBRICATING EYE DROPS OP) Place 1 drop into both eyes 2 (two) times daily as needed (dry eyes).    Marland Kitchen FLUoxetine (PROZAC) 20 MG capsule Take 60 mg by mouth daily.    . fluticasone (FLONASE) 50 MCG/ACT nasal spray Place 2 sprays into both nostrils daily. 16 g 11  . loratadine (CLARITIN) 10 MG tablet Take 10 mg by mouth daily.    . Magnesium 400 MG TABS Take 400 mg by mouth daily.    . naproxen sodium (ALEVE) 220 MG tablet Take 220 mg by mouth daily as needed (pain).    . nicotine polacrilex (NICORETTE) 4 MG gum Take 4 mg by mouth as needed for smoking cessation.    Marland Kitchen olmesartan-hydrochlorothiazide (BENICAR HCT) 40-25 MG tablet  Take 1 tablet by mouth daily.    . predniSONE (DELTASONE) 20 MG tablet Take 20 mg by mouth daily.    . promethazine (PHENERGAN) 6.25 MG/5ML syrup Take 10 mLs (12.5 mg total) by mouth every 8 (eight) hours as needed (cough). No driving when taking 810 mL 0  . Psyllium (METAMUCIL PO) Take 1 Dose by mouth daily as needed (constipation).     No facility-administered medications prior to visit.    Review of Systems  Constitutional: Positive for malaise/fatigue. Negative for chills, diaphoresis, fever and weight loss.  HENT: Negative for congestion.   Respiratory: Positive for cough and shortness of breath. Negative for hemoptysis, sputum production and wheezing.   Cardiovascular: Negative for  chest pain, palpitations and leg swelling.     Objective:   Vitals:   10/17/20 1428  BP: 130/60  Pulse: 79  Temp: (!) 97.1 F (36.2 C)  TempSrc: Temporal  SpO2: 92%  Weight: 146 lb 12.8 oz (66.6 kg)  Height: 5' (1.524 m)       Physical Exam: General: Well-appearing, no acute distress HENT: [place], AT Eyes: EOMI, no scleral icterus Respiratory: Clear to auscultation bilaterally.  No crackles, wheezing or rales Cardiovascular: RRR, -M/R/G, no JVD Extremities:-Edema,-tenderness Neuro: AAO x4, CNII-XII grossly intact Skin: Intact, no rashes or bruising Psych: Normal mood, normal affect   Chest Imaging- films reviewed: CTA chest 01/13/2015-centrilobular emphysema, airway thickening, linear scar in RML.  Nodule in lateral RML.  Linear scars in bilateral lower lobes.  Posterior diaphragmatic eventration on the left.  No significant mediastinal or hilar adenopathy.  CT Chest 03/28/20 - Centrilobular and paraseptal emphysema. S/p radiation to RML with decreased nodule size  CT Chest 09/28/20 - Centrilobular and paraseptal emphysema. S/p radiation to RML with interval decreased nodule size  Pulmonary Functions Testing Results: PFT Results Latest Ref Rng & Units 02/16/2015  FVC-Pre L 1.70   FVC-Predicted Pre % 72  FVC-Post L 1.66  FVC-Predicted Post % 70  Pre FEV1/FVC % % 51  Post FEV1/FCV % % 52  FEV1-Pre L 0.86  FEV1-Predicted Pre % 47  FEV1-Post L 0.87  DLCO uncorrected ml/min/mmHg 8.54  DLCO UNC% % 37  DLVA Predicted % 65  TLC L 4.70  TLC % Predicted % 95  RV % Predicted % 143   2016- Moderate obstruction, severe diffusion impairment  Echocardiogram 01/05/2015: LVEF 55 to 71%, diastolic dysfunction.  Moderate TR.  Normal LA, RV.  Elevated PASP.  Normal mitral and aortic valves.   Heart Catheterization:  RHC 02/17/2015: RHC (6/16) with mean RA 6, PA 42/24 mean 32, mean PCWP 16, CI 2.69, PVR 3.4 WU. 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 Chronic obstructive pulmonary disease, unspecified COPD type ([place])  J44.9   2. Tobacco abuse  Z72.0   3. Allergic rhinitis due to other allergic trigger, unspecified seasonality  J30.89      COPD, GOLD C -START Trelegy ONE puff ONCE a puff. Sample for one month. Will assess effectiveness at follow-up --STOP Anoro inhaler while using the above inhaler. --CONTINUE Albuterol AS NEEDED. Use nebulizer at home and use handheld when out -Scheduled Ophthalmology appointment  Tobacco abuse Patient is an active smoker. We discussed smoking cessation for 3 minutes. We discussed triggers and stressors and ways to deal with them. We discussed barriers to continued smoking and benefits of smoking cessation. Provided patient with information cessation techniques and interventions including [place] quitline.  Allergic rhinitis causing chronic cough -CONTINUE Claritin daily  -CONTINUE Flonase 2 sprays per nare at least once daily -REFILL Phenergan cough syrup as needed; instructed to avoid driving when using this.  Recommend against chronic codeine-based cough syrup use, especially with chronic constipation history.  RML stage IA  NSCLC -S/p SBRT in 2021  Health Maintenance Immunization History  Administered Date(s) Administered  . Influenza Split 05/29/2016  . Influenza, High Dose Seasonal PF 05/08/2019, 06/14/2020  . Influenza,inj,Quad PF,6+ Mos 05/31/2015  . Influenza-Unspecified 05/29/2016  . PFIZER(Purple Top)SARS-COV-2 Vaccination 10/15/2019, 11/10/2019  . Pneumococcal Conjugate-13 12/15/2015  . Pneumococcal Polysaccharide-23 10/05/2013   CT Lung Screen - not indicated  in patient with active lung cancer  No orders of the defined types were placed in this encounter.  Meds ordered this encounter  Medications  . Fluticasone-Umeclidin-Vilant (TRELEGY ELLIPTA) 100-62.5-25 MCG/INH AEPB    Sig: Inhale 1 puff into the lungs daily.    Dispense:  2 each    Refill:  0    Order Specific Question:   Lot Number?    Answer:   [id number]    Order Specific Question:   Expiration Date?    Answer:   05/19/2022    Order Specific Question:   Manufacturer?    Answer:   [employer]Smith[employer]line [[id number]]    Order Specific Question:   Quantity    Answer:   2  . promethazine (PHENERGAN) 6.25 MG/5ML syrup    Sig: Take 10 mLs (12.5 mg total) by mouth every 8 (eight) hours as needed (cough). No driving when taking    Dispense:  240 mL    Refill:  0   Return in about 1 month (around 11/14/2020).   I have spent a total time of 45-minutes on the day of the appointment for this new patient to me in reviewing prior documentation, coordinating care and discussing medical diagnosis and plan with the patient/family. Imaging, labs and tests included in this note have been reviewed and interpreted independently by me.   Rodman Pickle, MD [facility] 10/17/2020 2:17 PM

## 2020-10-17 NOTE — Patient Instructions (Addendum)
COPD, GOLD C -START Trelegy ONE puff ONCE a day. Sample for one month --STOP Anoro inhaler while using the above inhaler. --CONTINUE Albuterol AS NEEDED. Use nebulizer at home and use handheld when out -Patient to schedule Ophthalmology appointment  Tobacco abuse Patient is an active smoker. We discussed smoking cessation for 3 minutes. We discussed triggers and stressors and ways to deal with them. We discussed barriers to continued smoking and benefits of smoking cessation. Provided patient with information cessation techniques and interventions including [place] quitline.  Allergic rhinitis causing chronic cough -CONTINUE Claritin daily  -CONTINUE Flonase 2 sprays per nare at least once daily -REFILL Phenergan cough syrup as needed; instructed to avoid driving when using this.  Recommend against chronic codeine-based cough syrup use, especially with chronic constipation history.  Follow-up with me in one month to assess Trelegy          Smoking Tobacco Information, Adult Smoking tobacco can be harmful to your health. Tobacco contains a poisonous (toxic), colorless chemical called nicotine. Nicotine is addictive. It changes the brain and can make it hard to stop smoking. Tobacco also has other toxic chemicals that can hurt your body and raise your risk of many cancers. How can smoking tobacco affect me? Smoking tobacco puts you at risk for:  Cancer. Smoking is most commonly associated with lung cancer, but can also lead to cancer in other parts of the body.  Chronic obstructive pulmonary disease (COPD). This is a long-term lung condition that makes it hard to breathe. It also gets worse over time.  High blood pressure (hypertension), heart disease, stroke, or heart attack.  Lung infections, such as pneumonia.  Cataracts. This is when the lenses in the eyes become clouded.  Digestive problems. This may include peptic ulcers, heartburn, and gastroesophageal reflux disease  (GERD).  Oral health problems, such as gum disease and tooth loss.  Loss of taste and smell. Smoking can affect your appearance by causing:  Wrinkles.  Yellow or stained teeth, fingers, and fingernails. Smoking tobacco can also affect your social life, because:  It may be challenging to find places to smoke when away from home. Many workplaces, [employer], hotels, and public places are tobacco-free.  Smoking is expensive. This is due to the cost of tobacco and the long-term costs of treating health problems from smoking.  Secondhand smoke may affect those around you. Secondhand smoke can cause lung cancer, breathing problems, and heart disease. Children of smokers have a higher risk for: ? Sudden infant death syndrome (SIDS). ? Ear infections. ? Lung infections. If you currently smoke tobacco, quitting now can help you:  Lead a longer and healthier life.  Look, smell, breathe, and feel better over time.  Save money.  Protect others from the harms of secondhand smoke. What actions can I take to prevent health problems? Quit smoking  Do not start smoking. Quit if you already do.  Make a plan to quit smoking and commit to it. Look for programs to help you and ask your health care provider for recommendations and ideas.  Set a date and write down all the reasons you want to quit.  Let your friends and family know you are quitting so they can help and support you. Consider finding friends who also want to quit. It can be easier to quit with someone else, so that you can support each other.  Talk with your health care provider about using nicotine replacement medicines to help you quit, such as gum, lozenges, patches, sprays,  or pills.  Do not replace cigarette smoking with electronic cigarettes, which are commonly called e-cigarettes. The safety of e-cigarettes is not known, and some may contain harmful chemicals.  If you try to quit but return to smoking, stay positive. It is  common to slip up when you first quit, so take it one day at a time.  Be prepared for cravings. When you feel the urge to smoke, chew gum or suck on hard candy.   Lifestyle  Stay busy and take care of your body.  Drink enough fluid to keep your urine pale yellow.  Get plenty of exercise and eat a healthy diet. This can help prevent weight gain after quitting.  Monitor your eating habits. Quitting smoking can cause you to have a larger appetite than when you smoke.  Find ways to relax. Go out with friends or family to a movie or a restaurant where people do not smoke.  Ask your health care provider about having regular tests (screenings) to check for cancer. This may include blood tests, imaging tests, and other tests.  Find ways to manage your stress, such as meditation, yoga, or exercise. Where to find support To get support to quit smoking, consider:  Asking your health care provider for more information and resources.  Taking classes to learn more about quitting smoking.  Looking for local organizations that offer resources about quitting smoking.  Joining a support group for people who want to quit smoking in your local community.  Calling the smokefree.gov counselor helpline: 1-800-Quit-Now 289-643-8496) Where to find more information You may find more information about quitting smoking from:  HelpGuide.org: www.helpguide.org  https://hall.com/: smokefree.gov  American Lung Association: www.lung.org Contact a health care provider if you:  Have problems breathing.  Notice that your lips, nose, or fingers turn blue.  Have chest pain.  Are coughing up blood.  Feel faint or you pass out.  Have other health changes that cause you to worry. Summary  Smoking tobacco can negatively affect your health, the health of those around you, your finances, and your social life.  Do not start smoking. Quit if you already do. If you need help quitting, ask your health care  provider.  Think about joining a support group for people who want to quit smoking in your local community. There are many effective programs that will help you to quit this behavior. This information is not intended to replace advice given to you by your health care provider. Make sure you discuss any questions you have with your health care provider. Document Revised: 05/01/2019 Document Reviewed: 08/21/2016 Elsevier Patient Education  2021 [employer].

## 2020-10-19 ENCOUNTER — Other Ambulatory Visit: Payer: Self-pay | Admitting: Critical Care Medicine

## 2020-10-19 DIAGNOSIS — R911 Solitary pulmonary nodule: Secondary | ICD-10-CM

## 2020-10-26 ENCOUNTER — Encounter: Payer: Self-pay | Admitting: Pulmonary Disease

## 2020-11-25 ENCOUNTER — Telehealth: Payer: Self-pay | Admitting: Pulmonary Disease

## 2020-11-25 MED ORDER — TRELEGY ELLIPTA 100-62.5-25 MCG/INH IN AEPB: 1.0000 | INHALATION_SPRAY | Freq: Every day | RESPIRATORY_TRACT | 2 refills | Status: DC

## 2020-11-25 NOTE — Telephone Encounter (Signed)
Called and spoke with pt and she stated that she is almost out of the Trelegy samples and this is working well for her.  I have sent refills to the pharmacy and pt stated that she has a pending appt on 04/19 with Loanne Drilling.

## 2020-12-06 ENCOUNTER — Encounter: Payer: Self-pay | Admitting: Pulmonary Disease

## 2020-12-06 ENCOUNTER — Ambulatory Visit: Payer: Medicare HMO | Admitting: Pulmonary Disease

## 2020-12-06 ENCOUNTER — Other Ambulatory Visit: Payer: Self-pay

## 2020-12-06 VITALS — BP 130/70 | HR 87 | Temp 97.3°F | Ht 60.0 in | Wt 148.0 lb

## 2020-12-06 DIAGNOSIS — J3089 Other allergic rhinitis: Secondary | ICD-10-CM

## 2020-12-06 DIAGNOSIS — J449 Chronic obstructive pulmonary disease, unspecified: Secondary | ICD-10-CM

## 2020-12-06 MED ORDER — TRELEGY ELLIPTA 100-62.5-25 MCG/INH IN AEPB: 1.0000 | INHALATION_SPRAY | Freq: Every day | RESPIRATORY_TRACT | 3 refills | Status: AC

## 2020-12-06 MED ORDER — TRELEGY ELLIPTA 100-62.5-25 MCG/INH IN AEPB: 1.0000 | INHALATION_SPRAY | Freq: Every day | RESPIRATORY_TRACT | 0 refills | Status: DC

## 2020-12-06 MED ORDER — IPRATROPIUM BROMIDE 0.06 % NA SOLN: 2.0000 | Freq: Every day | NASAL | 3 refills | Status: DC

## 2020-12-06 NOTE — Progress Notes (Signed)
Synopsis: Referred in August 2016 for emphysema by Bartholome Bill, MD.  Previously patient of Dr. Lake Bells.  Subjective:   PATIENT ID: Katrina Ball GENDER: female DOB: 02-08-1946, MRN: [id number]  Chief Complaint  Patient presents with  . Follow-up    Improved, Trelegy working well. 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  Synopsis: She is a former patient of Dr. Carlis Abbott. On review of prior records in EMR. She recently communicated with Dr. Carlis Abbott via telephone encounter on 09/26/20 regarding albuterol formula. She has been compliant with her Anoro. Has previously seen an Ophthalmologist for eye issues requiring drops with the last visit in 2021 with Dr. Shanon Rosser but currently not taking eye drops for dryness. She is an active smoker and smoking at least 2 cigarettes a day.   Since our last visit, she has been compliant with her Trelegy and feel that it is working well. Her ventolin is better than her albuterol. She uses her ventolin once a day in the day routinely. Seldomly requires it as rescue inhaler. She has allergies and has sinus/nasal congestion with post nasal drainage triggered by allergies. She is on claritin and flonase  Social: Active smoker. 11 pack-years  Past Medical History:  Diagnosis Date  . Anemia    "used to be" (12/23/2012)  . Anxiety   . Arrhythmia   . Arthritis   . Cataract   . Chest pain   . COPD (chronic obstructive pulmonary disease) ([place])   . Depression   . Exertional shortness of breath   . Fracture of tibial plateau, closed 12/23/2012  . GERD (gastroesophageal reflux disease)   . Heart murmur 2016   Dr. Aundra Dubin did not hear a murmer  . Hypertension   . lung ca dx'd 01/2020  . Numbness and tingling in hands   . Sleep apnea    No Known Allergies    Outpatient Medications Prior to  Visit  Medication Sig Dispense Refill  . Carboxymethylcellul-Glycerin (LUBRICATING EYE DROPS OP) Place 1 drop into both eyes 2 (two) times daily as needed (dry eyes).    Marland Kitchen FLUoxetine (PROZAC) 20 MG capsule Take 60 mg by mouth daily.    . fluticasone (FLONASE) 50 MCG/ACT nasal spray Place 2 sprays into both nostrils daily. 16 g 11  . Fluticasone-Umeclidin-Vilant (TRELEGY ELLIPTA) 100-62.5-25 MCG/INH AEPB Inhale 1 puff into the lungs daily. 28 each 2  . loratadine (CLARITIN) 10 MG tablet Take 10 mg by mouth daily.    . Magnesium 400 MG TABS Take 400 mg by mouth daily.    . naproxen sodium (ALEVE) 220 MG tablet Take 220 mg by mouth daily as needed (pain).    . nicotine polacrilex (NICORETTE) 4 MG gum Take 4 mg by mouth as needed for smoking cessation.    Marland Kitchen olmesartan-hydrochlorothiazide (BENICAR HCT) 40-25 MG tablet Take 1 tablet by mouth daily.    . promethazine (PHENERGAN) 6.25 MG/5ML syrup Take 10 mLs (12.5 mg total) by mouth every 8 (eight) hours as needed (cough). No driving when taking 761 mL 0  . Psyllium (METAMUCIL PO) Take 1 Dose by mouth daily as needed (constipation).    . VENTOLIN HFA 108 (90 Base) MCG/ACT inhaler INHALE 2 PUFFS INTO THE LUNGS EVERY 4 HOURS AS NEEDED FOR WHEEZING OR SHORTNESS OF  BREATH. 18 each 3   No facility-administered medications prior to visit.    Review of Systems  Constitutional: Negative for chills, diaphoresis, fever, malaise/fatigue and weight loss.  HENT: Negative for congestion.   Respiratory: Positive for shortness of breath. Negative for cough, hemoptysis, sputum production and wheezing.   Cardiovascular: Negative for chest pain, palpitations and leg swelling.     Objective:   Vitals:   12/06/20 1127  BP: 130/70  Pulse: 87  Temp: (!) 97.3 F (36.3 C)  SpO2: 94%  Weight: 148 lb (67.1 kg)  Height: 5' (1.524 m)    Physical Exam: General: Elderly, no acute distress HENT: [place], AT Eyes: EOMI, no scleral icterus Respiratory: Clear to  auscultation bilaterally.  No crackles, wheezing or rales Cardiovascular: RRR, -M/R/G, no JVD Extremities:-Edema,-tenderness Neuro: AAO x4, CNII-XII grossly intact Psych: Normal mood, normal affect   Chest Imaging- films reviewed: CTA chest 01/13/2015-centrilobular emphysema, airway thickening, linear scar in RML.  Nodule in lateral RML.  Linear scars in bilateral lower lobes.  Posterior diaphragmatic eventration on the left.  No significant mediastinal or hilar adenopathy.  CT Chest 03/28/20 - Centrilobular and paraseptal emphysema. S/p radiation to RML with decreased nodule size  CT Chest 09/28/20 - Centrilobular and paraseptal emphysema. S/p radiation to RML with interval decreased nodule size  Pulmonary Functions Testing Results: PFT Results Latest Ref Rng & Units 02/16/2015  FVC-Pre L 1.70  FVC-Predicted Pre % 72  FVC-Post L 1.66  FVC-Predicted Post % 70  Pre FEV1/FVC % % 51  Post FEV1/FCV % % 52  FEV1-Pre L 0.86  FEV1-Predicted Pre % 47  FEV1-Post L 0.87  DLCO uncorrected ml/min/mmHg 8.54  DLCO UNC% % 37  DLVA Predicted % 65  TLC L 4.70  TLC % Predicted % 95  RV % Predicted % 143   2016- Moderate obstruction, severe diffusion impairment  Echocardiogram 01/05/2015: LVEF 55 to 35%, diastolic dysfunction.  Moderate TR.  Normal LA, RV.  Elevated PASP.  Normal mitral and aortic valves.   Heart Catheterization:  RHC 02/17/2015: RHC (6/16) with mean RA 6, PA 42/24 mean 32, mean PCWP 16, CI 2.69, PVR 3.4 WU. 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    Assessment & Plan:     ICD-10-CM   1. Chronic obstructive pulmonary disease, unspecified COPD type ([place])  J44.9   2. Allergic rhinitis due to other allergic trigger, unspecified seasonality  J30.89     COPD, GOLD C -CONTINUE Trelegy ONE puff ONCE a puff. Rx for 6 months --CONTINUE  Ventolin AS NEEDED. Use nebulizer at home and use handheld when out  Allergic Rhinitis causing chronic cough -CONTINUE Claritin daily  --START Atrovent nasal spray 1 spray per nare in the morning -CONTINUE Flonase 2 sprays per nare once nightly  Tobacco abuse --Active smoker --Advised to quit smoking  RML stage IA NSCLC -S/p SBRT in 2021  Health Maintenance Immunization History  Administered Date(s) Administered  . Influenza Split 05/29/2016  . Influenza, High Dose Seasonal PF 05/08/2019, 06/14/2020  . Influenza,inj,Quad PF,6+ Mos 05/31/2015  . Influenza-Unspecified 05/29/2016  . PFIZER(Purple Top)SARS-COV-2 Vaccination 10/15/2019, 11/10/2019  . Pneumococcal Conjugate-13 12/15/2015  . Pneumococcal Polysaccharide-23 10/05/2013   CT Lung Screen - not indicated in patient with prior lung cancer  No orders of the defined types were placed in this encounter.  Meds ordered this encounter  Medications  .  Fluticasone-Umeclidin-Vilant (TRELEGY ELLIPTA) 100-62.5-25 MCG/INH AEPB    Sig: Inhale 1 puff into the lungs daily.    Dispense:  28 each    Refill:  3    Order Specific Question:   Lot Number?    Answer:   [id number]    Order Specific Question:   Expiration Date?    Answer:   05/19/2022    Order Specific Question:   Manufacturer?    Answer:   [employer] [12]    Order Specific Question:   Quantity    Answer:   2  . Fluticasone-Umeclidin-Vilant (TRELEGY ELLIPTA) 100-62.5-25 MCG/INH AEPB    Sig: Inhale 1 puff into the lungs daily.    Dispense:  28 each    Refill:  0    Order Specific Question:   Lot Number?    Answer:   [id number]  . ipratropium (ATROVENT) 0.06 % nasal spray    Sig: Place 2 sprays into both nostrils daily.    Dispense:  15 mL    Refill:  3   Return in about 6 months (around 06/07/2021).   I have spent a total time of 20-minutes on the day of the appointment reviewing prior documentation, coordinating care and discussing medical diagnosis and plan with the  patient/family. Imaging, labs and tests included in this note have been reviewed and interpreted independently by me.   Charan Prieto Rodman Pickle, MD [facility] 12/06/2020 11:42 AM

## 2020-12-06 NOTE — Patient Instructions (Addendum)
COPD, GOLD C -CONTINUE Trelegy ONE puff ONCE a puff. Rx for 6 months --CONTINUE Ventolin AS NEEDED. Use nebulizer at home and use handheld when out  Allergic Rhinitis causing chronic cough -CONTINUE Claritin daily  --START Atrovent nasal spray 1 spray per nare in the morning -CONTINUE Flonase 2 sprays per nare once nightly  QUIT SMOKING!  Follow-up with me in 6 months

## 2020-12-12 ENCOUNTER — Encounter: Payer: Self-pay | Admitting: Pulmonary Disease

## 2020-12-17 ENCOUNTER — Emergency Department (HOSPITAL_BASED_OUTPATIENT_CLINIC_OR_DEPARTMENT_OTHER)
Admission: EM | Admit: 2020-12-17 | Discharge: 2020-12-17 | Disposition: A | Discharge status: Home / Self Care | Payer: Medicare HMO | Attending: Emergency Medicine | Admitting: Emergency Medicine

## 2020-12-17 ENCOUNTER — Emergency Department (HOSPITAL_BASED_OUTPATIENT_CLINIC_OR_DEPARTMENT_OTHER): Payer: Medicare HMO

## 2020-12-17 ENCOUNTER — Encounter (HOSPITAL_BASED_OUTPATIENT_CLINIC_OR_DEPARTMENT_OTHER): Payer: Self-pay | Admitting: Emergency Medicine

## 2020-12-17 ENCOUNTER — Other Ambulatory Visit: Payer: Self-pay

## 2020-12-17 DIAGNOSIS — M7989 Other specified soft tissue disorders: Secondary | ICD-10-CM | POA: Diagnosis present

## 2020-12-17 DIAGNOSIS — R2241 Localized swelling, mass and lump, right lower limb: Secondary | ICD-10-CM

## 2020-12-17 DIAGNOSIS — F17210 Nicotine dependence, cigarettes, uncomplicated: Secondary | ICD-10-CM | POA: Insufficient documentation

## 2020-12-17 DIAGNOSIS — J449 Chronic obstructive pulmonary disease, unspecified: Secondary | ICD-10-CM | POA: Insufficient documentation

## 2020-12-17 DIAGNOSIS — I10 Essential (primary) hypertension: Secondary | ICD-10-CM | POA: Insufficient documentation

## 2020-12-17 DIAGNOSIS — Z7951 Long term (current) use of inhaled steroids: Secondary | ICD-10-CM | POA: Diagnosis not present

## 2020-12-17 DIAGNOSIS — Z79899 Other long term (current) drug therapy: Secondary | ICD-10-CM | POA: Diagnosis not present

## 2020-12-17 MED ORDER — CEPHALEXIN 500 MG PO CAPS: 500.0000 mg | ORAL_CAPSULE | Freq: Four times a day (QID) | ORAL | 0 refills | Status: AC

## 2020-12-17 MED ORDER — PREDNISONE 20 MG PO TABS
40.0000 mg | ORAL_TABLET | Freq: Once | ORAL | Status: AC
  Administered 2020-12-17: 40 mg via ORAL
  Filled 2020-12-17: qty 2

## 2020-12-17 MED ORDER — CEPHALEXIN 250 MG PO CAPS
500.0000 mg | ORAL_CAPSULE | Freq: Once | ORAL | Status: AC
  Administered 2020-12-17: 500 mg via ORAL
  Filled 2020-12-17: qty 2

## 2020-12-17 MED ORDER — PREDNISONE 10 MG PO TABS: 40.0000 mg | ORAL_TABLET | Freq: Every day | ORAL | 0 refills | Status: AC

## 2020-12-17 NOTE — ED Notes (Signed)
AVS and 2 Rx's reviewed with client and her daughter, opportunity for questions also provided. Pt escorted by wc to exit/ POV

## 2020-12-17 NOTE — ED Provider Notes (Signed)
[facility] EMERGENCY DEPARTMENT Provider Note   CSN: [id number] Arrival date & time: 12/17/20  1810     History Chief Complaint  Patient presents with  . Foot Swelling    Katrina Ball is a 75 y.o. female.  Right foot pain worsening over the last day or so.  Mild redness and swelling.  No trauma reported no fevers chills.  No red streaking up the leg.  Pain with movement pain with light touch.  Better at rest.  Able to ambulate.        Past Medical History:  Diagnosis Date  . Anemia    "used to be" (12/23/2012)  . Anxiety   . Arrhythmia   . Arthritis   . Cataract   . Chest pain   . COPD (chronic obstructive pulmonary disease) ([place])   . Depression   . Exertional shortness of breath   . Fracture of tibial plateau, closed 12/23/2012  . GERD (gastroesophageal reflux disease)   . Heart murmur 2016   Dr. Aundra Dubin did not hear a murmer  . Hypertension   . lung ca dx'd 01/2020  . Numbness and tingling in hands   . Sleep apnea     Patient Active Problem List   Diagnosis Date Noted  . 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 Pulmonary nodule, right 12/29/2019  . Vision changes 06/29/2019  . Fatigue 12/15/2015  . Tobacco abuse 05/31/2015  . Pulmonary hypertension ([place]) 02/02/2015  . Chest pain 12/23/2014  . SOB (shortness of breath) 12/23/2014  . PVC's (premature ventricular contractions) 12/23/2014  . PAC (premature atrial contraction) 12/23/2014  . COPD (chronic obstructive pulmonary disease) ([place])   . Acute posthemorrhagic anemia 01/22/2013  . Allergic rhinitis 01/08/2013  . Essential hypertension, benign 01/08/2013  . Unspecified constipation 01/08/2013  . Fracture of tibial plateau, closed 12/23/2012    Past Surgical History:  Procedure Laterality Date  . BRONCHIAL BIOPSY  12/29/2019   Procedure: BRONCHIAL BIOPSIES;  Surgeon: Collene Gobble, MD;  Location: [facility] ENDOSCOPY;  Service: Pulmonary;;  . BRONCHIAL BRUSHINGS   12/29/2019   Procedure: BRONCHIAL BRUSHINGS;  Surgeon: Collene Gobble, MD;  Location[facility] ENDOSCOPY;  Service: Pulmonary;;  . BRONCHIAL NEEDLE ASPIRATION BIOPSY  12/29/2019   Procedure: BRONCHIAL NEEDLE ASPIRATION BIOPSIES;  Surgeon: Collene Gobble, MD;  Location: [facility] ENDOSCOPY;  Service: Pulmonary;;  . CARDIAC CATHETERIZATION N/A 02/17/2015   Procedure: Right Heart Cath;  Surgeon: Larey Dresser, MD;  Location: [facility] CV LAB;  Service: Cardiovascular;  Laterality: N/A;  . COLONOSCOPY    . FIDUCIAL MARKER PLACEMENT  12/29/2019   Procedure: FIDUCIAL MARKER PLACEMENT;  Surgeon: Collene Gobble, MD;  Location[facility] ENDOSCOPY;  Service: Pulmonary;;  . INCISION AND DRAINAGE OF WOUND  12/07/2012   "LLE" (12/23/2012)  . ORIF PROXIMAL TIBIAL PLATEAU FRACTURE Left 12/23/2012  . ORIF TIBIA PLATEAU Left 12/23/2012   Procedure: OPEN REDUCTION INTERNAL FIXATION (ORIF) LEFT TIBIAL PLATEAU;  Surgeon: Johnny Bridge, MD;  Location: [facility];  Service: Orthopedics;  Laterality: Left;  . TUBAL LIGATION    . VIDEO BRONCHOSCOPY WITH ENDOBRONCHIAL NAVIGATION N/A 12/29/2019   Procedure: VIDEO BRONCHOSCOPY WITH ENDOBRONCHIAL NAVIGATION;  Surgeon: Collene Gobble, MD;  Location: [facility] ENDOSCOPY;  Service: Pulmonary;  Laterality: N/A;     OB History   No obstetric history on file.     Family History  Problem Relation Age of Onset  . CVA Mother   . Heart failure Mother   .  Heart disease Mother   . Asthma Mother     Social History   Tobacco Use  . Smoking status: Current Every Day Smoker    Packs/day: 0.25    Years: 45.00    Pack years: 11.25    Types: Cigarettes  . Smokeless tobacco: Never Used  . Tobacco comment: 4 cigarettes per day   Vaping Use  . Vaping Use: Never used  Substance Use Topics  . Alcohol use: Not Currently    Comment: 12/23/2012 "alcohol intake varys greatly" 12/28/2019- not  rare  . Drug use: No    Home Medications Prior to Admission medications   Medication Sig Start Date End Date  Taking? Authorizing Provider  cephALEXin (KEFLEX) 500 MG capsule Take 1 capsule (500 mg total) by mouth 4 (four) times daily for 5 days. 12/17/20 12/22/20 Yes Breck Coons, MD  predniSONE (DELTASONE) 10 MG tablet Take 4 tablets (40 mg total) by mouth daily for 4 days. 12/17/20 12/21/20 Yes Breck Coons, MD  Carboxymethylcellul-Glycerin (LUBRICATING EYE DROPS OP) Place 1 drop into both eyes 2 (two) times daily as needed (dry eyes).    [provider]  FLUoxetine (PROZAC) 20 MG capsule Take 60 mg by mouth daily.    [provider]  fluticasone (FLONASE) 50 MCG/ACT nasal spray Place 2 sprays into both nostrils daily. 01/20/20   Julian Hy, DO  Fluticasone-Umeclidin-Vilant (TRELEGY ELLIPTA) 100-62.5-25 MCG/INH AEPB Inhale 1 puff into the lungs daily. 12/06/20   Margaretha Seeds, MD  Fluticasone-Umeclidin-Vilant (TRELEGY ELLIPTA) 100-62.5-25 MCG/INH AEPB Inhale 1 puff into the lungs daily. 12/06/20   Margaretha Seeds, MD  ipratropium (ATROVENT) 0.06 % nasal spray Place 2 sprays into both nostrils daily. 12/06/20   Margaretha Seeds, MD  loratadine (CLARITIN) 10 MG tablet Take 10 mg by mouth daily.    [provider]  Magnesium 400 MG TABS Take 400 mg by mouth daily.    [provider]  naproxen sodium (ALEVE) 220 MG tablet Take 220 mg by mouth daily as needed (pain).    [provider]  nicotine polacrilex (NICORETTE) 4 MG gum Take 4 mg by mouth as needed for smoking cessation.    [provider]  olmesartan-hydrochlorothiazide (BENICAR HCT) 40-25 MG tablet Take 1 tablet by mouth daily. 10/16/19   [provider]  promethazine (PHENERGAN) 6.25 MG/5ML syrup Take 10 mLs (12.5 mg total) by mouth every 8 (eight) hours as needed (cough). No driving when taking 6/38/45   Margaretha Seeds, MD  Psyllium (METAMUCIL PO) Take 1 Dose by mouth daily as needed (constipation).    [provider]  VENTOLIN HFA 108 (90 Base) MCG/ACT inhaler INHALE 2 PUFFS  INTO THE LUNGS EVERY 4 HOURS AS NEEDED FOR WHEEZING OR SHORTNESS OF BREATH. 10/19/20   Margaretha Seeds, MD    Allergies    Patient has no known allergies.  Review of Systems   Review of Systems  Constitutional: Negative for chills and fever.  HENT: Negative for congestion and rhinorrhea.   Respiratory: Negative for cough and shortness of breath.   Cardiovascular: Negative for chest pain and palpitations.  Gastrointestinal: Negative for diarrhea, nausea and vomiting.  Genitourinary: Negative for difficulty urinating and dysuria.  Musculoskeletal: Positive for arthralgias and joint swelling. Negative for back pain.  Skin: Positive for color change. Negative for rash and wound.  Neurological: Negative for light-headedness and headaches.    Physical Exam Updated Vital Signs BP (!) 184/83 (BP Location: Right Arm)  Pulse 92   Temp 98.9 F (37.2 C) (Oral)   Resp 20   Ht 5' (1.524 m)   Wt 67.1 kg   SpO2 94%   BMI 28.90 kg/m   Physical Exam Vitals and nursing note reviewed. Exam conducted with a chaperone present.  Constitutional:      General: She is not in acute distress.    Appearance: Normal appearance.  HENT:     Head: Normocephalic and atraumatic.     Nose: No rhinorrhea.  Eyes:     General:        Right eye: No discharge.        Left eye: No discharge.     Conjunctiva/sclera: Conjunctivae normal.  Cardiovascular:     Rate and Rhythm: Normal rate and regular rhythm.  Pulmonary:     Effort: Pulmonary effort is normal. No respiratory distress.     Breath sounds: No stridor.  Abdominal:     General: Abdomen is flat. There is no distension.     Palpations: Abdomen is soft.  Musculoskeletal:        General: Swelling and tenderness present. No signs of injury.     Comments: No bony tenderness.  No open wounds.  Mild erythema.  Tender to lateral touch, neurovascular intact.  No crepitus.  Able to bear weight.  Skin:    General: Skin is warm and dry.  Neurological:      General: No focal deficit present.     Mental Status: She is alert. Mental status is at baseline.     Motor: No weakness.  Psychiatric:        Mood and Affect: Mood normal.        Behavior: Behavior normal.     ED Results / Procedures / Treatments   Labs (all labs ordered are listed, but only abnormal results are displayed) Labs Reviewed - No data to display  EKG None  Radiology DG Foot Complete Right  Result Date: 12/17/2020 CLINICAL DATA:  Dorsal pain and swelling greatest at second and third metatarsophalangeal joints EXAM: RIGHT FOOT COMPLETE - 3+ VIEW COMPARISON:  None. FINDINGS: Frontal, oblique, and lateral views of the right foot are obtained. Mild hallux valgus deformity with osteoarthritis of the first metatarsophalangeal joint. No acute displaced fractures. Remaining joint spaces are well preserved. Dorsal soft tissue swelling of the forefoot at the level of the metatarsophalangeal joints. IMPRESSION: 1. Mild hallux valgus deformity and osteoarthritis of the first metatarsophalangeal joint. 2. Dorsal soft tissue swelling of the forefoot. 3. No acute bony abnormality. Electronically Signed   By: Randa Ngo M.D.   On: 12/17/2020 21:13    Procedures Procedures   Medications Ordered in ED Medications  cephALEXin (KEFLEX) capsule 500 mg (500 mg Oral Given 12/17/20 2206)  predniSONE (DELTASONE) tablet 40 mg (40 mg Oral Given 12/17/20 2206)    ED Course  I have reviewed the triage vital signs and the nursing notes.  Pertinent labs & imaging results that were available during my care of the patient were reviewed by me and considered in my medical decision making (see chart for details).    MDM Rules/Calculators/A&P                          Cellulitis versus gout.  Able to bear weight no fevers no systemic signs of illness.  Overall well-appearing tolerating p.o.  Will give antibiotics.  Chance this could be gout and we are unable to assess  we will give a prednisone  burst to have her follow-up.   Final Clinical Impression(s) / ED Diagnoses Final diagnoses:  Localized swelling of right foot    Rx / DC Orders ED Discharge Orders         Ordered    predniSONE (DELTASONE) 10 MG tablet  Daily        12/17/20 2156    cephALEXin (KEFLEX) 500 MG capsule  4 times daily        12/17/20 2156           Breck Coons, MD 12/18/20 [phone]

## 2020-12-17 NOTE — ED Provider Notes (Signed)
MSE was initiated and I personally evaluated the patient and placed orders (if any) at  7:55 PM on December 17, 2020.  Patient is a 75 year old female with past medical history of lung cancer currently followed by oncology as well as COPD who presents to the ED with complaints of atraumatic right foot pain.  On my examination, patient is endorsing pain over dorsum of foot, second and third MTP joint.  No history of pseudogout or gout.  However, suspect inflammatory arthritis.  Given lack of known inflammatory arthritis, will obtain plain films.  She is able to wiggle her toes.  Sensitivity to light touch.  Doubt septic arthritis.  Afebrile.  The patient appears stable so that the remainder of the MSE may be completed by another provider.   Corena Herter, PA-C 12/17/20 2006    Breck Coons, MD 12/18/20 1450

## 2020-12-17 NOTE — ED Notes (Signed)
Xrays done

## 2020-12-17 NOTE — ED Triage Notes (Signed)
Pt arrives pov with c/o right foot pain and swelling that started last night. Pt denies injury. Swelling and redness noted

## 2020-12-17 NOTE — ED Notes (Signed)
ED Provider at bedside. 

## 2021-01-18 ENCOUNTER — Emergency Department (HOSPITAL_BASED_OUTPATIENT_CLINIC_OR_DEPARTMENT_OTHER): Payer: Medicare HMO

## 2021-01-18 ENCOUNTER — Other Ambulatory Visit: Payer: Self-pay

## 2021-01-18 ENCOUNTER — Emergency Department (HOSPITAL_BASED_OUTPATIENT_CLINIC_OR_DEPARTMENT_OTHER)
Admission: EM | Admit: 2021-01-18 | Discharge: 2021-01-18 | Disposition: A | Discharge status: Home / Self Care | Payer: Medicare HMO | Attending: Emergency Medicine | Admitting: Emergency Medicine

## 2021-01-18 ENCOUNTER — Encounter (HOSPITAL_BASED_OUTPATIENT_CLINIC_OR_DEPARTMENT_OTHER): Payer: Self-pay | Admitting: Emergency Medicine

## 2021-01-18 DIAGNOSIS — Z7951 Long term (current) use of inhaled steroids: Secondary | ICD-10-CM | POA: Diagnosis not present

## 2021-01-18 DIAGNOSIS — Z79899 Other long term (current) drug therapy: Secondary | ICD-10-CM | POA: Insufficient documentation

## 2021-01-18 DIAGNOSIS — M549 Dorsalgia, unspecified: Secondary | ICD-10-CM | POA: Insufficient documentation

## 2021-01-18 DIAGNOSIS — Z85118 Personal history of other malignant neoplasm of bronchus and lung: Secondary | ICD-10-CM | POA: Insufficient documentation

## 2021-01-18 DIAGNOSIS — M542 Cervicalgia: Secondary | ICD-10-CM | POA: Diagnosis present

## 2021-01-18 DIAGNOSIS — R109 Unspecified abdominal pain: Secondary | ICD-10-CM | POA: Diagnosis not present

## 2021-01-18 DIAGNOSIS — J449 Chronic obstructive pulmonary disease, unspecified: Secondary | ICD-10-CM | POA: Insufficient documentation

## 2021-01-18 DIAGNOSIS — I10 Essential (primary) hypertension: Secondary | ICD-10-CM | POA: Insufficient documentation

## 2021-01-18 DIAGNOSIS — F17210 Nicotine dependence, cigarettes, uncomplicated: Secondary | ICD-10-CM | POA: Insufficient documentation

## 2021-01-18 LAB — COMPREHENSIVE METABOLIC PANEL
ALT: 11 U/L (ref 0–44)
AST: 19 U/L (ref 15–41)
Albumin: 3.9 g/dL (ref 3.5–5.0)
Alkaline Phosphatase: 80 U/L (ref 38–126)
Anion gap: 10 (ref 5–15)
BUN: 37 mg/dL — ABNORMAL HIGH (ref 8–23)
CO2: 25 mmol/L (ref 22–32)
Calcium: 9.5 mg/dL (ref 8.9–10.3)
Chloride: 98 mmol/L (ref 98–111)
Creatinine, Ser: 1.37 mg/dL — ABNORMAL HIGH (ref 0.44–1.00)
GFR, Estimated: 41 mL/min — ABNORMAL LOW (ref >60)
Glucose, Bld: 100 mg/dL — ABNORMAL HIGH (ref 70–99)
Potassium: 4.9 mmol/L (ref 3.5–5.1)
Sodium: 133 mmol/L — ABNORMAL LOW (ref 135–145)
Total Bilirubin: 0.2 mg/dL — ABNORMAL LOW (ref 0.3–1.2)
Total Protein: 7.9 g/dL (ref 6.5–8.1)

## 2021-01-18 LAB — CBC WITH DIFFERENTIAL/PLATELET
Abs Immature Granulocytes: 0.03 10*3/uL (ref 0.00–0.07)
Basophils Absolute: 0.1 10*3/uL (ref 0.0–0.1)
Basophils Relative: 1 %
Eosinophils Absolute: 0.1 10*3/uL (ref 0.0–0.5)
Eosinophils Relative: 1 %
HCT: 38.3 % (ref 36.0–46.0)
Hemoglobin: 12.4 g/dL (ref 12.0–15.0)
Immature Granulocytes: 0 %
Lymphocytes Relative: 12 %
Lymphs Abs: 1.1 10*3/uL (ref 0.7–4.0)
MCH: 31 pg (ref 26.0–34.0)
MCHC: 32.4 g/dL (ref 30.0–36.0)
MCV: 95.8 fL (ref 80.0–100.0)
Monocytes Absolute: 0.7 10*3/uL (ref 0.1–1.0)
Monocytes Relative: 8 %
Neutro Abs: 7.2 10*3/uL (ref 1.7–7.7)
Neutrophils Relative %: 78 %
Platelets: 293 10*3/uL (ref 150–400)
RBC: 4 MIL/uL (ref 3.87–5.11)
RDW: 14.1 % (ref 11.5–15.5)
WBC: 9.2 10*3/uL (ref 4.0–10.5)
nRBC: 0 % (ref 0.0–0.2)

## 2021-01-18 LAB — URINALYSIS, MICROSCOPIC (REFLEX)

## 2021-01-18 LAB — URINALYSIS, ROUTINE W REFLEX MICROSCOPIC
Bilirubin Urine: NEGATIVE
Glucose, UA: NEGATIVE mg/dL
Hgb urine dipstick: NEGATIVE
Ketones, ur: NEGATIVE mg/dL
Nitrite: NEGATIVE
Protein, ur: NEGATIVE mg/dL
Specific Gravity, Urine: 1.015 (ref 1.005–1.030)
pH: 6 (ref 5.0–8.0)

## 2021-01-18 LAB — LIPASE, BLOOD: Lipase: 46 U/L (ref 11–51)

## 2021-01-18 LAB — TROPONIN I (HIGH SENSITIVITY): Troponin I (High Sensitivity): 5 ng/L (ref <18)

## 2021-01-18 MED ORDER — DIAZEPAM 5 MG/ML IJ SOLN
2.5000 mg | Freq: Once | INTRAMUSCULAR | Status: AC
  Administered 2021-01-18: 2.5 mg via INTRAVENOUS
  Filled 2021-01-18: qty 2

## 2021-01-18 MED ORDER — MORPHINE SULFATE (PF) 4 MG/ML IV SOLN
4.0000 mg | Freq: Once | INTRAVENOUS | Status: AC
Start: 2021-01-18 — End: 2021-01-18
  Administered 2021-01-18: 4 mg via INTRAVENOUS
  Filled 2021-01-18: qty 1

## 2021-01-18 MED ORDER — DIAZEPAM 5 MG PO TABS: 5.0000 mg | ORAL_TABLET | Freq: Two times a day (BID) | ORAL | 0 refills | Status: AC

## 2021-01-18 MED ORDER — HYDROCODONE-ACETAMINOPHEN 5-325 MG PO TABS: 1.0000 | ORAL_TABLET | ORAL | 0 refills | Status: AC | PRN

## 2021-01-18 MED ORDER — ONDANSETRON HCL 4 MG/2ML IJ SOLN
4.0000 mg | Freq: Once | INTRAMUSCULAR | Status: AC
  Administered 2021-01-18: 4 mg via INTRAVENOUS
  Filled 2021-01-18: qty 2

## 2021-01-18 NOTE — ED Notes (Signed)
O2 sats 90-91% on RA; NAD

## 2021-01-18 NOTE — ED Notes (Signed)
Patient transported to CT 

## 2021-01-18 NOTE — ED Provider Notes (Signed)
[facility] EMERGENCY DEPARTMENT Provider Note   CSN: [id number] Arrival date & time: 01/18/21  1239     History Chief Complaint  Patient presents with  . Neck Pain  . Back Pain    Katrina Ball is a 75 y.o. female.  HPI      Friday neck pain like slept wrong then slowly worsened over the weekend, moves from one side to another, radiates up towards ear. At first couldn't turn head because of the pain. Can't move it because of pain.  Icy hot, tylenol, didn't feel like it helped much.  No falls or trauma.  No numbness or weakness.  This morning neck pain still there, and today began to have pain left flank pain.  Has not taken anything for it. Severe with movement 9/10.    No nausea, vomiting, initially reports no abdominal pain but then does note it on exam Has hx of COPD, does feel short of breath but not having new symptoms today No urinary symptoms No fever, trauma, chest pain No constipation or diarrhea No loss of control of bowel or bladder   Past Medical History:  Diagnosis Date  . Anemia    "used to be" (12/23/2012)  . Anxiety   . Arrhythmia   . Arthritis   . Cataract   . Chest pain   . COPD (chronic obstructive pulmonary disease) ([place])   . Depression   . Exertional shortness of breath   . Fracture of tibial plateau, closed 12/23/2012  . GERD (gastroesophageal reflux disease)   . Heart murmur 2016   Dr. Aundra Dubin did not hear a murmer  . Hypertension   . lung ca dx'd 01/2020  . Numbness and tingling in hands   . Sleep apnea     Patient Active Problem List   Diagnosis Date Noted  . Malignant neoplasm of middle lobe of right lung ([place]) 01/07/2020  . Pulmonary nodule, right 12/29/2019  . Vision changes 06/29/2019  . Fatigue 12/15/2015  . Tobacco abuse 05/31/2015  . Pulmonary hypertension ([place]) 02/02/2015  . Chest pain 12/23/2014  . SOB (shortness of breath) 12/23/2014  . PVC's (premature ventricular contractions) 12/23/2014  . PAC (premature  atrial contraction) 12/23/2014  . COPD (chronic obstructive pulmonary disease) ([place])   . Acute posthemorrhagic anemia 01/22/2013  . Allergic rhinitis 01/08/2013  . Essential hypertension, benign 01/08/2013  . Unspecified constipation 01/08/2013  . Fracture of tibial plateau, closed 12/23/2012    Past Surgical History:  Procedure Laterality Date  . BRONCHIAL BIOPSY  12/29/2019   Procedure: BRONCHIAL BIOPSIES;  Surgeon: Collene Gobble, MD;  Location: [facility] ENDOSCOPY;  Service: Pulmonary;;  . BRONCHIAL BRUSHINGS  12/29/2019   Procedure: BRONCHIAL BRUSHINGS;  Surgeon: Collene Gobble, MD;  Location: [facility] ENDOSCOPY;  Service: Pulmonary;;  . BRONCHIAL NEEDLE ASPIRATION BIOPSY  12/29/2019   Procedure: BRONCHIAL NEEDLE ASPIRATION BIOPSIES;  Surgeon: Collene Gobble, MD;  Location: [facility] ENDOSCOPY;  Service: Pulmonary;;  . CARDIAC CATHETERIZATION N/A 02/17/2015   Procedure: Right Heart Cath;  Surgeon: Larey Dresser, MD;  Location: [facility] CV LAB;  Service: Cardiovascular;  Laterality: N/A;  . COLONOSCOPY    . FIDUCIAL MARKER PLACEMENT  12/29/2019   Procedure: FIDUCIAL MARKER PLACEMENT;  Surgeon: Collene Gobble, MD;  Location: [facility] ENDOSCOPY;  Service: Pulmonary;;  . INCISION AND DRAINAGE OF WOUND  12/07/2012   "LLE" (12/23/2012)  . ORIF PROXIMAL TIBIAL PLATEAU FRACTURE Left 12/23/2012  . ORIF TIBIA PLATEAU Left 12/23/2012   Procedure: OPEN REDUCTION  INTERNAL FIXATION (ORIF) LEFT TIBIAL PLATEAU;  Surgeon: Johnny Bridge, MD;  Location: [facility];  Service: Orthopedics;  Laterality: Left;  . TUBAL LIGATION    . VIDEO BRONCHOSCOPY WITH ENDOBRONCHIAL NAVIGATION N/A 12/29/2019   Procedure: VIDEO BRONCHOSCOPY WITH ENDOBRONCHIAL NAVIGATION;  Surgeon: Collene Gobble, MD;  Location: [facility] ENDOSCOPY;  Service: Pulmonary;  Laterality: N/A;     OB History   No obstetric history on file.     Family History  Problem Relation Age of Onset  . CVA Mother   . Heart failure Mother   . Heart disease Mother   . Asthma  Mother     Social History   Tobacco Use  . Smoking status: Current Every Day Smoker    Packs/day: 0.25    Years: 45.00    Pack years: 11.25    Types: Cigarettes  . Smokeless tobacco: Never Used  . Tobacco comment: 4 cigarettes per day   Vaping Use  . Vaping Use: Never used  Substance Use Topics  . Alcohol use: Not Currently    Comment: 12/23/2012 "alcohol intake varys greatly" 12/28/2019- not  rare  . Drug use: No    Home Medications Prior to Admission medications   Medication Sig Start Date End Date Taking? Authorizing Provider  diazepam (VALIUM) 5 MG tablet Take 1 tablet (5 mg total) by mouth 2 (two) times daily. 01/18/21  Yes Isla Pence, MD  HYDROcodone-acetaminophen (NORCO/VICODIN) 5-325 MG tablet Take 1 tablet by mouth every 4 (four) hours as needed. 01/18/21  Yes Isla Pence, MD  Carboxymethylcellul-Glycerin (LUBRICATING EYE DROPS OP) Place 1 drop into both eyes 2 (two) times daily as needed (dry eyes).    [provider]  FLUoxetine (PROZAC) 20 MG capsule Take 60 mg by mouth daily.    [provider]  fluticasone (FLONASE) 50 MCG/ACT nasal spray Place 2 sprays into both nostrils daily. 01/20/20   Julian Hy, DO  Fluticasone-Umeclidin-Vilant (TRELEGY ELLIPTA) 100-62.5-25 MCG/INH AEPB Inhale 1 puff into the lungs daily. 12/06/20   Margaretha Seeds, MD  Fluticasone-Umeclidin-Vilant (TRELEGY ELLIPTA) 100-62.5-25 MCG/INH AEPB Inhale 1 puff into the lungs daily. 12/06/20   Margaretha Seeds, MD  ipratropium (ATROVENT) 0.06 % nasal spray Place 2 sprays into both nostrils daily. 12/06/20   Margaretha Seeds, MD  loratadine (CLARITIN) 10 MG tablet Take 10 mg by mouth daily.    [provider]  Magnesium 400 MG TABS Take 400 mg by mouth daily.    [provider]  naproxen sodium (ALEVE) 220 MG tablet Take 220 mg by mouth daily as needed (pain).    [provider]  nicotine polacrilex (NICORETTE) 4 MG gum Take 4 mg by mouth as needed for  smoking cessation.    [provider]  olmesartan-hydrochlorothiazide (BENICAR HCT) 40-25 MG tablet Take 1 tablet by mouth daily. 10/16/19   [provider]  promethazine (PHENERGAN) 6.25 MG/5ML syrup Take 10 mLs (12.5 mg total) by mouth every 8 (eight) hours as needed (cough). No driving when taking 8/41/32   Margaretha Seeds, MD  Psyllium (METAMUCIL PO) Take 1 Dose by mouth daily as needed (constipation).    [provider]  VENTOLIN HFA 108 (90 Base) MCG/ACT inhaler INHALE 2 PUFFS INTO THE LUNGS EVERY 4 HOURS AS NEEDED FOR WHEEZING OR SHORTNESS OF BREATH. 10/19/20   Margaretha Seeds, MD    Allergies    Patient has no known allergies.  Review of Systems   Review of Systems  Constitutional:  Negative for fever.  HENT: Negative for sore throat.   Eyes: Negative for visual disturbance.  Respiratory: Negative for cough (unchanged) and shortness of breath.   Cardiovascular: Negative for chest pain.  Gastrointestinal: Positive for abdominal pain. Negative for diarrhea, nausea and vomiting.  Genitourinary: Positive for flank pain. Negative for difficulty urinating and dysuria.  Musculoskeletal: Positive for back pain and neck pain.  Skin: Negative for rash.  Neurological: Negative for syncope, facial asymmetry, weakness, numbness and headaches.    Physical Exam Updated Vital Signs BP (!) 141/55 (BP Location: Right Arm)   Pulse 90   Temp 98.5 F (36.9 C)   Resp 18   Ht 5' (1.524 m)   Wt 64.9 kg   SpO2 92%   BMI 27.93 kg/m   Physical Exam Vitals and nursing note reviewed.  Constitutional:      General: She is not in acute distress.    Appearance: She is well-developed. She is not diaphoretic.  HENT:     Head: Normocephalic and atraumatic.  Eyes:     Conjunctiva/sclera: Conjunctivae normal.  Cardiovascular:     Rate and Rhythm: Normal rate and regular rhythm.     Heart sounds: Normal heart sounds. No murmur heard. No friction rub. No gallop.    Pulmonary:     Effort: Pulmonary effort is normal. No respiratory distress.     Breath sounds: Normal breath sounds. No wheezing or rales.  Abdominal:     General: There is no distension.     Palpations: Abdomen is soft.     Tenderness: There is abdominal tenderness. There is left CVA tenderness. There is no guarding.  Musculoskeletal:        General: Tenderness (bilateral paracervical, bilateral lumbar back) present.     Cervical back: Normal range of motion.  Skin:    General: Skin is warm and dry.     Findings: No erythema or rash.  Neurological:     Mental Status: She is alert and oriented to person, place, and time.     ED Results / Procedures / Treatments   Labs (all labs ordered are listed, but only abnormal results are displayed) Labs Reviewed  COMPREHENSIVE METABOLIC PANEL - Abnormal; Notable for the following components:      Result Value   Sodium 133 (*)    Glucose, Bld 100 (*)    BUN 37 (*)    Creatinine, Ser 1.37 (*)    Total Bilirubin 0.2 (*)    GFR, Estimated 41 (*)    All other components within normal limits  URINALYSIS, ROUTINE W REFLEX MICROSCOPIC - Abnormal; Notable for the following components:   Leukocytes,Ua SMALL (*)    All other components within normal limits  URINALYSIS, MICROSCOPIC (REFLEX) - Abnormal; Notable for the following components:   Bacteria, UA RARE (*)    Non Squamous Epithelial PRESENT (*)    All other components within normal limits  CBC WITH DIFFERENTIAL/PLATELET  LIPASE, BLOOD  TROPONIN I (HIGH SENSITIVITY)    EKG EKG Interpretation  Date/Time:  Wednesday January 18 2021 15:19:13 EDT Ventricular Rate:  89 PR Interval:  150 QRS Duration: 57 QT Interval:  367 QTC Calculation: 447 R Axis:   71 Text Interpretation: Sinus rhythm Anterior infarct, old No significant change since last tracing Confirmed by Isla Pence [phone]) on 01/18/2021 4:31:53 PM   Radiology CT Renal Stone Study  Result Date: 01/18/2021 CLINICAL DATA:   Onset left flank pain this morning. Mild scarring is present the lung bases.  EXAM: CT ABDOMEN AND PELVIS WITHOUT CONTRAST TECHNIQUE: Multidetector CT imaging of the abdomen and pelvis was performed following the standard protocol without IV contrast. COMPARISON:  CT chest 12/21/2019. FINDINGS: Lower chest: There is mild dependent atelectasis in the lung bases. Emphysema noted. Heart size is normal. No pleural or pericardial effusion. Hepatobiliary: No focal liver abnormality is seen. No gallstones, gallbladder wall thickening, or biliary dilatation. Pancreas: Unremarkable. No pancreatic ductal dilatation or surrounding inflammatory changes. Spleen: Normal in size without focal abnormality. Adrenals/Urinary Tract: Adrenal glands are unremarkable. Kidneys are normal, without renal calculi, focal lesion, or hydronephrosis. Bladder is unremarkable. Stomach/Bowel: Stomach is within normal limits. Appendix appears normal. No evidence of bowel wall thickening, distention, or inflammatory changes. Extensive descending and sigmoid colon diverticulosis noted. Vascular/Lymphatic: Aortic atherosclerosis. No enlarged abdominal or pelvic lymph nodes. Reproductive: Uterus and bilateral adnexa are unremarkable. Other: None. Musculoskeletal: No acute or focal bony abnormality. Lower lumbar degenerative disc disease is seen. There is a simple lipoma in the right gluteus minimus. IMPRESSION: Negative for urinary tract stone. No acute finding abdomen or pelvis. Diverticulosis without diverticulitis. Aortic Atherosclerosis (ICD10-I70.0) and Emphysema (ICD10-J43.9). Electronically Signed   By: Inge Rise M.D.   On: 01/18/2021 14:42    Procedures Procedures   Medications Ordered in ED Medications  diazepam (VALIUM) injection 2.5 mg (2.5 mg Intravenous Given 01/18/21 1419)  morphine 4 MG/ML injection 4 mg (4 mg Intravenous Given 01/18/21 1556)  ondansetron (ZOFRAN) injection 4 mg (4 mg Intravenous Given 01/18/21 1555)    ED  Course  I have reviewed the triage vital signs and the nursing notes.  Pertinent labs & imaging results that were available during my care of the patient were reviewed by me and considered in my medical decision making (see chart for details).    MDM Rules/Calculators/A&P                          75yo female with history above presents with concern for neck pain and stiffness since Friday and left flank pain and abdominal tenderness beginning today.  EKG without acute findings   Neck pain without trauma, doubt fx.  Worse with movements, suspect musculoskeletal etiology. No sign of need for emergent spinal surgery or imaging.  No fevers, numbness or weakness, normal pulses, history not consistent with SAH, dissection. Suspect likely muscular spasm and given valium  Left flank pain and abdominal tenderness, consider nephrolithiasis or diverticulitis.  UA negative. Labs without pancreatitis or hepatitis. Possible msk pain, CT stone study pending and trop pending.    Final Clinical Impression(s) / ED Diagnoses Final diagnoses:  Neck pain  Acute left flank pain    Rx / DC Orders ED Discharge Orders         Ordered    HYDROcodone-acetaminophen (NORCO/VICODIN) 5-325 MG tablet  Every 4 hours PRN        01/18/21 1551    diazepam (VALIUM) 5 MG tablet  2 times daily        01/18/21 1551           Gareth Morgan, MD 01/19/21 0630

## 2021-01-18 NOTE — ED Triage Notes (Signed)
Pt states her neck started to bother her on Friday.  Worsening pain on Sunday and has not improved.  Pt also c/o left mid back pain today.  No known injuries.

## 2021-01-18 NOTE — ED Notes (Signed)
O2 dc'd at this time to trial pt again

## 2021-01-18 NOTE — ED Notes (Signed)
O2 sats 88-91% on RA, but do not consistently stay above 90%; EDP notified, wants to place pt back on 1L [place] for now; 1L [place] applied.

## 2021-01-18 NOTE — ED Notes (Signed)
O2 dc'd a this time; will continue to monitor; NAD

## 2021-01-18 NOTE — ED Notes (Signed)
O2 dc'd again for trial

## 2021-01-18 NOTE — ED Notes (Signed)
Desat after pain meds, placed on 2lpm [place].

## 2021-01-18 NOTE — ED Notes (Addendum)
Neck pain since Friday.  Has tried Tylenol and "Icy Hot" ointment with minimal temporary relief.   Left Flank pain started this morning- hurts with any movement.  Denies dysuria or hematuria

## 2021-01-18 NOTE — ED Provider Notes (Signed)
Pt signed out by Dr. Barbee Cough pending symptomatic improvement and labs/CT scan.  Labs do not show anything acute.  She is still having pain after valium, so she was given morphine/zofran.  O2 sats did drop briefly and she was put on 2L.  O2 sats are back up now on RA.  Pt's pain is msk in nature.  She is stable for d/c.  Return if worse.  CT: IMPRESSION:  Negative for urinary tract stone. No acute finding abdomen or  pelvis.    Diverticulosis without diverticulitis.    Aortic Atherosclerosis (ICD10-I70.0) and Emphysema (ICD10-J43.9).        Isla Pence, MD 01/18/21 [phone]

## 2021-02-15 ENCOUNTER — Telehealth: Payer: Self-pay | Admitting: Pulmonary Disease

## 2021-02-15 IMAGING — CT NM PET TUM IMG INITIAL (PI) SKULL BASE T - THIGH
1 of 4 series · 2 of 25 positions shown · non-contrast
Comparison: CT chest 12/21/2019 and 01/13/2015.

CLINICAL DATA: Initial treatment strategy for lung nodule.

EXAM:
NUCLEAR MEDICINE PET SKULL BASE TO THIGH
TECHNIQUE: 7.0 mCi F-18 FDG was injected intravenously. Full-ring PET imaging
was performed from the skull base to thigh after the radiotracer. CT
data was obtained and used for attenuation correction and anatomic
localization.
Fasting blood glucose: 108 mg/dl

[Series 4: ct sk_thigh 5.0 b31f · axial · 5.0mm · 0.98mm/px · z∈[-472,-348]mm · 2 of 212 slices shown]
[im 181/212  brain]
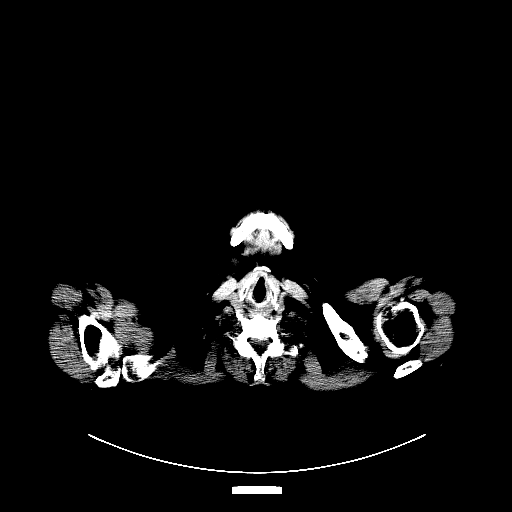
[im 212/212  brain]
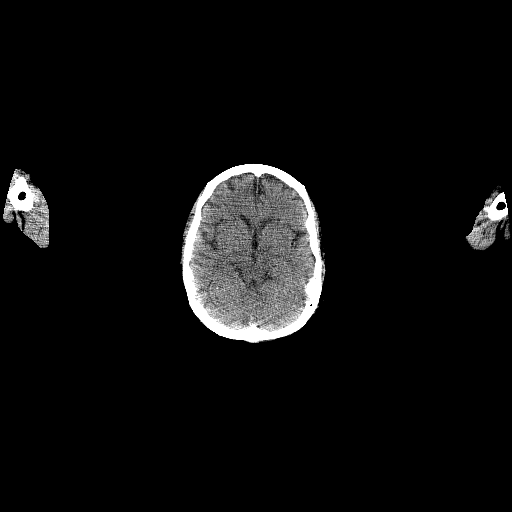

[2 of 25 positions shown; findings below may reference images not displayed]

FINDINGS: Mediastinal blood pool activity: SUV max

Liver activity: SUV max NA

NECK:

No abnormal hypermetabolism. Focal hypermetabolism associated with
the left maxillary sinus is likely infectious/inflammatory in
etiology.

Incidental CT findings:

Near complete opacification of the left maxillary sinus with
surrounding bone thickening, indicating chronicity.

CHEST:

No hypermetabolic mediastinal, hilar or axillary lymph nodes.
Irregular 1.3 x 1.4 cm nodule in the lateral segment right middle
lobe (8/51) has an SUV max of 7.9.

Incidental CT findings:

Atherosclerotic calcification of the aorta and coronary arteries.
Heart size normal. No pericardial or pleural effusion. Centrilobular
emphysema. Fiducial markers in the lateral segment right middle
lobe. Mesentery motion degrades image quality.

ABDOMEN/PELVIS:

No abnormal hypermetabolism in the liver, adrenal glands, spleen or
pancreas. No hypermetabolic lymph nodes.

Incidental CT findings:

Liver, gallbladder and right adrenal gland are unremarkable. Slight
thickening of the left adrenal gland. Visualized portions of the
kidneys, spleen, pancreas, stomach and bowel are grossly
unremarkable. Atherosclerotic calcification of the aorta.

SKELETON:

No abnormal osseous hypermetabolism.

Incidental CT findings:

Degenerative changes in the spine.
IMPRESSION: 1. Hypermetabolic right middle lobe nodule, consistent with stage IA
primary bronchogenic carcinoma.
2. Aortic atherosclerosis (ZQ2KR-XC8.8). Coronary artery
calcification.
3.  Emphysema ([id number]).

## 2021-02-15 MED ORDER — DOXYCYCLINE HYCLATE 100 MG PO TABS: 100.0000 mg | ORAL_TABLET | Freq: Two times a day (BID) | ORAL | 0 refills | Status: DC

## 2021-02-15 NOTE — Telephone Encounter (Signed)
Called and spoke to pt. Pt c/o increase in DOE, chest congestion, prod cough with green mucus, intermittent chest tightness. Pt denies wheezing. Pt is taking Trelegy daily and last took albuterol hfa yesterday. Pt states she is also taking mucinex and claritin which she believes is helping. Pt is requesting recs. Pt also states she is moving to [place] in August and questioning if Dr. Garnette Scheuermann recommends a provider in [place].   Will forward to Eric Form, NP, for sick message as Dr. Loanne Drilling is unavailable.   Will forward to Dr. Loanne Drilling to advise about a pulmonologist in [place].

## 2021-02-15 NOTE — Telephone Encounter (Signed)
Called and spoke to pt. Informed her of the recs per Eric Form, NP. Rx sent to preferred pharmacy. Pt has a pending appt with Derl Barrow, NP, on 7/21. Per SG pt needs to see Dr. Loanne Drilling. Appt scheduled with Dr. Loanne Drilling for 8/11.   Dr. Loanne Drilling, please advise if ok for pt to keep August appt with you or if she should be seen sooner by APP. Thanks.

## 2021-02-15 NOTE — Telephone Encounter (Signed)
I checked all of the schedules for the providers in office today and tomorrow. I did not see any openings.

## 2021-02-15 NOTE — Telephone Encounter (Signed)
Please send in Doxycycline 100 mg BID x 7 days. Take with full glass of water Use probiotic while on antibiotic, or eat Activia yogurt Use Sun Block if in the sun Will need follow up with Dr. Loanne Drilling after treatment to ensure she is better.

## 2021-02-15 NOTE — Telephone Encounter (Signed)
Does anyone have availability for a video visit today?

## 2021-02-16 NOTE — Telephone Encounter (Signed)
The sick call was addressed by Judson Roch 6/29. Dr. Loanne Drilling, please see message from [place] about pt's appts.

## 2021-02-22 NOTE — Telephone Encounter (Signed)
I do not personally know any Pulmonary physicians in [place] but please let her know that are happy to send records once she determines whom she will establish care with. Also, we can manage her care with in-person, video and telephone visits if there is a gap period until she is seen.

## 2021-02-22 NOTE — Telephone Encounter (Signed)
Called and spoke with patient. She verbalized understanding. She has an appt with Beth on 7/21 at 12pm. She wishes to have this appt converted to a video visit. She confirmed her mobile number. Will go ahead and change the number.   Advised patient that she can always call us if needed. She verbalized understanding.   Nothing further needed at time of call.

## 2021-02-24 ENCOUNTER — Ambulatory Visit
Admission: RE | Admit: 2021-02-24 | Discharge: 2021-02-24 | Disposition: A | Discharge status: Home / Self Care | Payer: Medicare HMO | Source: Ambulatory Visit | Attending: Family Medicine | Admitting: Family Medicine

## 2021-02-24 ENCOUNTER — Other Ambulatory Visit: Payer: Self-pay | Admitting: Family Medicine

## 2021-02-24 ENCOUNTER — Other Ambulatory Visit: Payer: Self-pay

## 2021-02-24 DIAGNOSIS — R053 Chronic cough: Secondary | ICD-10-CM

## 2021-02-28 ENCOUNTER — Other Ambulatory Visit: Payer: Self-pay | Admitting: Family Medicine

## 2021-02-28 DIAGNOSIS — N644 Mastodynia: Secondary | ICD-10-CM

## 2021-03-02 ENCOUNTER — Other Ambulatory Visit: Payer: Medicare HMO

## 2021-03-09 ENCOUNTER — Other Ambulatory Visit: Payer: Self-pay | Admitting: Primary Care

## 2021-03-09 ENCOUNTER — Other Ambulatory Visit: Payer: Self-pay

## 2021-03-09 ENCOUNTER — Ambulatory Visit: Payer: Medicare HMO | Admitting: Primary Care

## 2021-03-09 ENCOUNTER — Encounter: Payer: Self-pay | Admitting: Primary Care

## 2021-03-09 VITALS — BP 132/64 | HR 94 | Temp 98.2°F | Ht 65.0 in | Wt 152.4 lb

## 2021-03-09 DIAGNOSIS — R053 Chronic cough: Secondary | ICD-10-CM

## 2021-03-09 DIAGNOSIS — J209 Acute bronchitis, unspecified: Secondary | ICD-10-CM | POA: Diagnosis not present

## 2021-03-09 DIAGNOSIS — J3089 Other allergic rhinitis: Secondary | ICD-10-CM

## 2021-03-09 DIAGNOSIS — J438 Other emphysema: Secondary | ICD-10-CM

## 2021-03-09 DIAGNOSIS — C342 Malignant neoplasm of middle lobe, bronchus or lung: Secondary | ICD-10-CM

## 2021-03-09 DIAGNOSIS — J42 Unspecified chronic bronchitis: Secondary | ICD-10-CM

## 2021-03-09 MED ORDER — PREDNISONE 10 MG PO TABS: ORAL_TABLET | ORAL | 0 refills | Status: AC

## 2021-03-09 MED ORDER — AZELASTINE HCL 0.1 % NA SOLN: 1.0000 | Freq: Two times a day (BID) | NASAL | 1 refills | Status: DC

## 2021-03-09 NOTE — Assessment & Plan Note (Signed)
-   Patient reports nasal drainage down the back of her throat, likely contributing to cough - Recommend adding Astelin nasal spray 1 puff per nostril twice daily - Continue Flonase nasal spray and Zyrtec 10mg  daily  - If no improvement with above plan would consider referral to ENT

## 2021-03-09 NOTE — Assessment & Plan Note (Addendum)
-   Productive cough x 6 months, purulent mucus in the morning. CXR 02/24/21 showed no evidence of acute airspace consolidation. Recommend she take mucinex 1,200mg  BID x 10 days, adding flutter valve TID. No further abx at this time, we will check sputum culture. Sending in RX for prednisone taper (40mg  x 2 days; 30mg  x 2 days; 20mg  x 2 days; 10mg  x 2 days).

## 2021-03-09 NOTE — Assessment & Plan Note (Addendum)
-   Continue TRELEGY 134mcg one puff daily; PRN Ventolin HFA 2 puffs every 6 hours as needed for sob/wheezing

## 2021-03-09 NOTE — Assessment & Plan Note (Addendum)
-   CT chest in February 2022 showed interval decrease in size of treated right middle lobe lesion consistent with response to therapy. No evidence of local recurrence or metastatic disease.  - Due for repeat imaging in August 2022

## 2021-03-09 NOTE — Progress Notes (Signed)
@Patient  ID: Katrina Ball, female    DOB: 11-11-1945, 75 y.o.   MRN: [id number]  Chief Complaint  Patient presents with   Follow-up    Dr. Loanne Drilling gave ABX still having SOB, cough with green mucous with some wheezing.    Referring provider: Bartholome Bill, MD  HPI: 75 year old female, everyday smoker (11.25-pack-year history).  Past medical history significant for COPD, allergic rhinitis, malignant neoplasm right middle lobe, hypertension, pulmonary hypertension.  Patient of Dr. Loanne Drilling.  Maintained on Trelegy Ellipta 100 mcg daily.  Previous [facility] pulmonary encounter: 12/06/20- Dr. 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  Synopsis: She is a former patient of Dr. Carlis Abbott. On review of prior records in EMR. She recently communicated with Dr. Carlis Abbott via telephone encounter on 09/26/20 regarding albuterol formula. She has been compliant with her Anoro. Has previously seen an Ophthalmologist for eye issues requiring drops with the last visit in 2021 with Dr. Shanon Rosser but currently not taking eye drops for dryness. She is an active smoker and smoking at least 2 cigarettes a day.   Since our last visit, she has been compliant with her Trelegy and feel that it is working well. Her ventolin is better than her albuterol. She uses her ventolin once a day in the day routinely. Seldomly requires it as rescue inhaler. She has allergies and has sinus/nasal congestion with post nasal drainage triggered by allergies. She is on claritin and flonase  Social: Active smoker. 11 pack-years   03/09/2021- Interim hx  Patient presents today for acute sick visit.  She reports symptoms of a chronic cough since January 2022.  Cough is productive with green mucus.  Associated shortness of breath and postnasal drip.  She was prescribed doxycycline 100 mg twice daily x7  days earlier this month from our office for bronchitis symptoms.  She reports no improvement after completing abx course.  Cough is worse first thing in the morning, once she is able to cough up mucus she feels better.  She has tried Mucinex for 1 week without any significant improvement.  She is using Trelegy 100 mcg daily as prescribed.  She uses Ventolin 1-2 times a day.    No Known Allergies  Immunization History  Administered Date(s) Administered   Influenza Split 05/29/2016   Influenza, High Dose Seasonal PF 05/08/2019, 06/14/2020   Influenza,inj,Quad PF,6+ Mos 05/31/2015   Influenza,inj,quad, With Preservative 05/31/2015   Influenza-Unspecified 05/29/2016   PFIZER(Purple Top)SARS-COV-2 Vaccination 10/15/2019, 11/10/2019   Pneumococcal Conjugate-13 12/15/2015   Pneumococcal Polysaccharide-23 10/05/2013    Past Medical History:  Diagnosis Date   Anemia    "used to be" (12/23/2012)   Anxiety    Arrhythmia    Arthritis    Cataract    Chest pain    COPD (chronic obstructive pulmonary disease) (HCC)    Depression    Exertional shortness of breath    Fracture of tibial plateau, closed 12/23/2012   GERD (gastroesophageal reflux disease)    Heart murmur 2016   Dr. Aundra Dubin did not hear a murmer   Hypertension    lung ca dx'd 01/2020   Numbness and tingling in hands    Sleep apnea     Tobacco History: Social History   Tobacco Use  Smoking Status Every Day   Packs/day: 0.25   Years: 45.00  Pack years: 11.25   Types: Cigarettes  Smokeless Tobacco Never  Tobacco Comments   4 cigarettes per day    Ready to quit: No Counseling given: Yes Tobacco comments: 4 cigarettes per day    Outpatient Medications Prior to Visit  Medication Sig Dispense Refill   Carboxymethylcellul-Glycerin (LUBRICATING EYE DROPS OP) Place 1 drop into both eyes 2 (two) times daily as needed (dry eyes).     diazepam (VALIUM) 5 MG tablet Take 1 tablet (5 mg total) by mouth 2 (two) times daily. 10  tablet 0   FLUoxetine (PROZAC) 20 MG capsule Take 60 mg by mouth daily.     fluticasone (FLONASE) 50 MCG/ACT nasal spray Place 2 sprays into both nostrils daily. 16 g 11   Fluticasone-Umeclidin-Vilant (TRELEGY ELLIPTA) 100-62.5-25 MCG/INH AEPB Inhale 1 puff into the lungs daily. 28 each 3   HYDROcodone-acetaminophen (NORCO/VICODIN) 5-325 MG tablet Take 1 tablet by mouth every 4 (four) hours as needed. 10 tablet 0   loratadine (CLARITIN) 10 MG tablet Take 10 mg by mouth daily.     Magnesium 400 MG TABS Take 400 mg by mouth daily.     naproxen sodium (ALEVE) 220 MG tablet Take 220 mg by mouth daily as needed (pain).     nicotine polacrilex (NICORETTE) 4 MG gum Take 4 mg by mouth as needed for smoking cessation.     olmesartan-hydrochlorothiazide (BENICAR HCT) 40-25 MG tablet Take 1 tablet by mouth daily.     promethazine (PHENERGAN) 6.25 MG/5ML syrup Take 10 mLs (12.5 mg total) by mouth every 8 (eight) hours as needed (cough). No driving when taking 160 mL 0   Psyllium (METAMUCIL PO) Take 1 Dose by mouth daily as needed (constipation).     VENTOLIN HFA 108 (90 Base) MCG/ACT inhaler INHALE 2 PUFFS INTO THE LUNGS EVERY 4 HOURS AS NEEDED FOR WHEEZING OR SHORTNESS OF BREATH. 18 each 3   Fluticasone-Umeclidin-Vilant (TRELEGY ELLIPTA) 100-62.5-25 MCG/INH AEPB Inhale 1 puff into the lungs daily. 28 each 0   ipratropium (ATROVENT) 0.06 % nasal spray Place 2 sprays into both nostrils daily. 15 mL 3   doxycycline (VIBRA-TABS) 100 MG tablet Take 1 tablet (100 mg total) by mouth 2 (two) times daily. (Patient not taking: Reported on 03/09/2021) 14 tablet 0   No facility-administered medications prior to visit.    Review of Systems  Review of Systems  Constitutional: Negative.   HENT:  Positive for congestion and postnasal drip.   Respiratory:  Positive for cough, shortness of breath and wheezing.     Physical Exam  BP 132/64 (BP Location: Left Arm, Cuff Size: Normal)   Pulse 94   Temp 98.2 F (36.8  C) (Oral)   Ht 5\' 5"  (1.651 m)   Wt 152 lb 6.4 oz (69.1 kg)   SpO2 94%   BMI 25.36 kg/m  Physical Exam Constitutional:      Appearance: Normal appearance.  HENT:     Head: Normocephalic and atraumatic.     Mouth/Throat:     Mouth: Mucous membranes are moist.     Pharynx: Oropharynx is clear.  Cardiovascular:     Rate and Rhythm: Normal rate and regular rhythm.  Pulmonary:     Effort: Pulmonary effort is normal.     Breath sounds: Normal breath sounds. No wheezing, rhonchi or rales.  Skin:    General: Skin is warm and dry.  Neurological:     General: No focal deficit present.     Mental Status: She is alert  and oriented to person, place, and time. Mental status is at baseline.  Psychiatric:        Mood and Affect: Mood normal.        Behavior: Behavior normal.        Thought Content: Thought content normal.        Judgment: Judgment normal.     Lab Results:  CBC    Component Value Date/Time   WBC 9.2 01/18/2021 1418   RBC 4.00 01/18/2021 1418   HGB 12.4 01/18/2021 1418   HCT 38.3 01/18/2021 1418   PLT 293 01/18/2021 1418   MCV 95.8 01/18/2021 1418   MCH 31.0 01/18/2021 1418   MCHC 32.4 01/18/2021 1418   RDW 14.1 01/18/2021 1418   LYMPHSABS 1.1 01/18/2021 1418   MONOABS 0.7 01/18/2021 1418   EOSABS 0.1 01/18/2021 1418   BASOSABS 0.1 01/18/2021 1418    BMET    Component Value Date/Time   NA 133 (L) 01/18/2021 1418   K 4.9 01/18/2021 1418   CL 98 01/18/2021 1418   CO2 25 01/18/2021 1418   GLUCOSE 100 (H) 01/18/2021 1418   BUN 37 (H) 01/18/2021 1418   CREATININE 1.37 (H) 01/18/2021 1418   CREATININE 1.37 (H) 09/28/2020 1031   CALCIUM 9.5 01/18/2021 1418   GFRNONAA 41 (L) 01/18/2021 1418   GFRNONAA 41 (L) 09/28/2020 1031   GFRAA 37 (L) 03/28/2020 1318    BNP No results found for: BNP  ProBNP No results found for: PROBNP  Imaging: DG Chest 2 View  Result Date: 02/25/2021 CLINICAL DATA:  Persistent cough 3 weeks with shortness of breath. History of  right lung cancer post radiation treatment. EXAM: CHEST - 2 VIEW COMPARISON:  12/29/2019 and chest CT 09/28/2020 FINDINGS: Lungs are adequately inflated and demonstrate postsurgical change over the right mid to lower lung. Stable chronic change over the posterior lung base on the lateral film. No acute airspace consolidation or effusion. Cardiomediastinal silhouette and remainder of the exam is unchanged. IMPRESSION: No acute cardiopulmonary disease. Electronically Signed   By: Marin Olp M.D.   On: 02/25/2021 15:15     Assessment & Plan:   COPD (chronic obstructive pulmonary disease) (HCC) - Continue TRELEGY 124mcg one puff daily; PRN Ventolin HFA 2 puffs every 6 hours as needed for sob/wheezing   Allergic rhinitis - Patient reports nasal drainage down the back of her throat, likely contributing to cough - Recommend adding Astelin nasal spray 1 puff per nostril twice daily - Continue Flonase nasal spray and Zyrtec 10mg  daily  - If no improvement with above plan would consider referral to ENT   Chronic bronchitis with acute exacerbation (HCC) - Productive cough x 6 months, purulent mucus in the morning. CXR 02/24/21 showed no evidence of acute airspace consolidation. Recommend she take mucinex 1,200mg  BID x 10 days, adding flutter valve TID. No further abx at this time, we will check sputum culture. Sending in RX for prednisone taper (40mg  x 2 days; 30mg  x 2 days; 20mg  x 2 days; 10mg  x 2 days).  Malignant neoplasm of middle lobe of right lung [facility]) - CT chest in February 2022 showed interval decrease in size of treated right middle lobe lesion consistent with response to therapy. No evidence of local recurrence or metastatic disease.  - Due for repeat imaging in August 2022   40 mins spent, >50% face to face with patient  Martyn Ehrich, NP 03/09/2021

## 2021-03-09 NOTE — Patient Instructions (Addendum)
Recommendations: - Stop ipratropium (Atrovent nasal spray) - Start Astelin nasal spray - 1 puff per nostril twice daily - Continue Flonase (Fluticasone) nasal spray once daily - Continue Mucinex 1200mg  twice a day for 10 days  Rx: - Prednisone taper as directed  Orders: - Sputum culture (respiratory and AF[employer])  Follow-up: - Keep apt on August 11th at 10:30am with Dr. Loanne Drilling

## 2021-03-30 ENCOUNTER — Ambulatory Visit: Payer: Medicare HMO | Admitting: Pulmonary Disease

## 2021-03-30 ENCOUNTER — Other Ambulatory Visit: Payer: Self-pay

## 2021-03-30 ENCOUNTER — Encounter: Payer: Self-pay | Admitting: Pulmonary Disease

## 2021-03-30 VITALS — BP 134/80 | HR 90 | Temp 98.4°F | Ht 65.0 in | Wt 152.0 lb

## 2021-03-30 DIAGNOSIS — J3089 Other allergic rhinitis: Secondary | ICD-10-CM

## 2021-03-30 DIAGNOSIS — J42 Unspecified chronic bronchitis: Secondary | ICD-10-CM

## 2021-03-30 DIAGNOSIS — J209 Acute bronchitis, unspecified: Secondary | ICD-10-CM

## 2021-03-30 DIAGNOSIS — J9611 Chronic respiratory failure with hypoxia: Secondary | ICD-10-CM

## 2021-03-30 DIAGNOSIS — J438 Other emphysema: Secondary | ICD-10-CM

## 2021-03-30 NOTE — Progress Notes (Signed)
Synopsis: Referred in August 2016 for emphysema by Bartholome Bill, MD.  Previously patient of Dr. Lake Bells.  Subjective:   PATIENT ID: Katrina Ball GENDER: female DOB: 01-22-1946, MRN: [id number]  Chief Complaint  Patient presents with   Follow-up    Exertional sob.  Sats 85% on arrival.  With rest up to 93%.  Still has post nasal drip.  Cough improves at times, still has cough.    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  Synopsis:  She is a former patient of Dr. Carlis Abbott. On review of prior records in EMR. She recently communicated with Dr. Carlis Abbott via telephone encounter on 09/26/20 regarding albuterol formula. She has been compliant with her Anoro. Has previously seen an Ophthalmologist for eye issues requiring drops with the last visit in 2021 with Dr. Shanon Rosser but currently not taking eye drops for dryness. She is an active smoker and smoking at least 2 cigarettes a day.   Since our last visit, she has been compliant with her Trelegy and feel that it is working well. Her ventolin is better than her albuterol. She uses her ventolin once a day in the day routinely. Seldomly requires it as rescue inhaler. She has allergies and has sinus/nasal congestion with post nasal drainage triggered by allergies. She is on claritin and flonase  03/30/21 She recently had COPD exacerbation in 02/2021 treated with steroid taper. She reports that her symptoms have improved. Has shortness of breath with activity. She still has post-nasal drainage. Currently taking 1 spray per nare per day of her astelin. Compliant with her Trelegy daily. She uses her albuterol 1-2 times a day pending activity. She is sedentary at baseline. Currently smoking 2-6 cigarettes daily. She is moving to be closer to family in [place] this month.  Social: Active smoker. 11 pack-years  Past  Medical History:  Diagnosis Date   Anemia    "used to be" (12/23/2012)   Anxiety    Arrhythmia    Arthritis    Cataract    Chest pain    COPD (chronic obstructive pulmonary disease) (HCC)    Depression    Exertional shortness of breath    Fracture of tibial plateau, closed 12/23/2012   GERD (gastroesophageal reflux disease)    Heart murmur 2016   Dr. Aundra Dubin did not hear a murmer   Hypertension    lung ca dx'd 01/2020   Numbness and tingling in hands    Sleep apnea    No Known Allergies    Outpatient Medications Prior to Visit  Medication Sig Dispense Refill   azelastine (ASTELIN) 0.1 % nasal spray Place 1 spray into both nostrils 2 (two) times daily. Use in each nostril as directed 30 mL 1   Carboxymethylcellul-Glycerin (LUBRICATING EYE DROPS OP) Place 1 drop into both eyes 2 (two) times daily as needed (dry eyes).     cetirizine (ZYRTEC) 10 MG tablet Take 10 mg by mouth daily.     diazepam (VALIUM) 5 MG tablet Take 1 tablet (5 mg total) by mouth 2 (two) times daily. 10 tablet 0   FLUoxetine (PROZAC) 20 MG capsule Take 60 mg by mouth daily.     fluticasone (FLONASE) 50 MCG/ACT nasal spray Place 2 sprays into both nostrils daily. 16 g 11   Fluticasone-Umeclidin-Vilant (TRELEGY ELLIPTA) 100-62.5-25 MCG/INH AEPB Inhale 1  puff into the lungs daily. 28 each 3   HYDROcodone-acetaminophen (NORCO/VICODIN) 5-325 MG tablet Take 1 tablet by mouth every 4 (four) hours as needed. 10 tablet 0   Magnesium 400 MG TABS Take 400 mg by mouth daily.     naproxen sodium (ALEVE) 220 MG tablet Take 220 mg by mouth daily as needed (pain).     nicotine polacrilex (NICORETTE) 4 MG gum Take 4 mg by mouth as needed for smoking cessation.     olmesartan-hydrochlorothiazide (BENICAR HCT) 40-25 MG tablet Take 1 tablet by mouth daily.     predniSONE (DELTASONE) 10 MG tablet Take 4 tabs po daily x 2 days; then 3 tabs for 2 days; then 2 tabs for 2 days; then 1 tab for 2 days 20 tablet 0   promethazine (PHENERGAN)  6.25 MG/5ML syrup Take 10 mLs (12.5 mg total) by mouth every 8 (eight) hours as needed (cough). No driving when taking 672 mL 0   Psyllium (METAMUCIL PO) Take 1 Dose by mouth daily as needed (constipation).     VENTOLIN HFA 108 (90 Base) MCG/ACT inhaler INHALE 2 PUFFS INTO THE LUNGS EVERY 4 HOURS AS NEEDED FOR WHEEZING OR SHORTNESS OF BREATH. 18 each 3   No facility-administered medications prior to visit.    Review of Systems  Constitutional:  Negative for chills, diaphoresis, fever, malaise/fatigue and weight loss.  HENT:  Positive for congestion.   Respiratory:  Positive for shortness of breath. Negative for cough, hemoptysis, sputum production and wheezing.   Cardiovascular:  Negative for chest pain, palpitations and leg swelling.    Objective:   Vitals:   03/30/21 1035  BP: 134/80  Pulse: 90  Temp: 98.4 F (36.9 C)  TempSrc: Oral  SpO2: 93%  Weight: 152 lb (68.9 kg)  Height: 5\' 5"  (1.651 m)    Physical Exam: General: Well-appearing, no acute distress HENT: [place], AT Eyes: EOMI, no scleral icterus Respiratory: Clear to auscultation bilaterally.  No crackles, wheezing or rales Cardiovascular: RRR, -M/R/G, no JVD Extremities:-Edema,-tenderness Neuro: AAO x4, CNII-XII grossly intact Psych: Normal mood, normal affect  Chest Imaging- films reviewed: CTA chest 01/13/2015-centrilobular emphysema, airway thickening, linear scar in RML.  Nodule in lateral RML.  Linear scars in bilateral lower lobes.  Posterior diaphragmatic eventration on the left.  No significant mediastinal or hilar adenopathy.  CT Chest 03/28/20 - Centrilobular and paraseptal emphysema. S/p radiation to RML with decreased nodule size  CT Chest 09/28/20 - Centrilobular and paraseptal emphysema. S/p radiation to RML with interval decreased nodule size  Pulmonary Functions Testing Results: PFT Results Latest Ref Rng & Units 02/16/2015  FVC-Pre L 1.70  FVC-Predicted Pre % 72  FVC-Post L 1.66  FVC-Predicted Post %  70  Pre FEV1/FVC % % 51  Post FEV1/FCV % % 52  FEV1-Pre L 0.86  FEV1-Predicted Pre % 47  FEV1-Post L 0.87  DLCO uncorrected ml/min/mmHg 8.54  DLCO UNC% % 37  DLVA Predicted % 65  TLC L 4.70  TLC % Predicted % 95  RV % Predicted % 143   2016- Moderate obstruction, severe diffusion impairment  Echocardiogram 01/05/2015: LVEF 55 to 09%, diastolic dysfunction.  Moderate TR.  Normal LA, RV.  Elevated PASP.  Normal mitral and aortic valves.   Heart Catheterization:  RHC 02/17/2015: RHC (6/16) with mean RA 6, PA 42/24 mean 32, mean PCWP 16, CI 2.69, PVR 3.4 WU. 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 Chronic bronchitis with acute exacerbation ([place])  J20.9 Ambulatory Referral for DME   J42     2. Other emphysema ([employer])  J43.8 Ambulatory Referral for DME    3. Allergic rhinitis due to other allergic trigger, unspecified seasonality  J30.89       COPD, GOLD C Chronic hypoxemic respiratory failure -CONTINUE Trelegy ONE puff ONCE a puff --CONTINUE Ventolin AS NEEDED. Use nebulizer at home and use handheld when out --Ambulatory oxygen demonstrates desaturation <88%. --START oxygen 1L via nasal cannula with activity and sleep  Allergic Rhinitis causing chronic cough -CONTINUE Claritin daily  --CONTINUE Astelin nasal spray 1 spray per nare in the morning and evening -CONTINUE Flonase 2 sprays per nare as needed  Tobacco abuse --QUIT smoking  RML stage IA NSCLC -S/p SBRT in 2021 --Scheduled for repeat CT per Rad Onc  Patient planning to transition Pulmonary care to [facility]. Once she knows where she wishes to be established, please contact our office and we are happy to send records.  Health Maintenance Immunization History  Administered Date(s) Administered   Influenza Split 05/29/2016   Influenza, High Dose Seasonal PF 05/08/2019, 06/14/2020   Influenza,inj,Quad  PF,6+ Mos 05/31/2015   Influenza,inj,quad, With Preservative 05/31/2015   Influenza-Unspecified 05/29/2016   PFIZER(Purple Top)SARS-COV-2 Vaccination 10/15/2019, 11/10/2019   Pneumococcal Conjugate-13 12/15/2015   Pneumococcal Polysaccharide-23 10/05/2013   CT Lung Screen - not indicated in patient with prior lung cancer  Orders Placed This Encounter  Procedures   Ambulatory Referral for DME    Referral Priority:   Routine    Referral Type:   Durable Medical Equipment Purchase    Number of Visits Requested:   1   No orders of the defined types were placed in this encounter.  Return if symptoms worsen or fail to improve.   I have spent a total time of 35-minutes on the day of the appointment reviewing prior documentation, coordinating care and discussing medical diagnosis and plan with the patient/family. Past medical history, allergies, medications were reviewed. Pertinent imaging, labs and tests included in this note have been reviewed and interpreted independently by me.  Mercades Bajaj Rodman Pickle, MD [facility] 03/30/2021 10:49 AM

## 2021-03-30 NOTE — Patient Instructions (Addendum)
COPD, GOLD C -CONTINUE Trelegy ONE puff ONCE a puff --CONTINUE Ventolin AS NEEDED. Use nebulizer at home and use handheld when out --Ambulatory oxygen demonstrates desaturation <88%. --START oxygen 1L via nasal cannula with activity and sleep  Allergic Rhinitis causing chronic cough -CONTINUE Claritin daily  --CONTINUE Astelin nasal spray 1 spray per nare in the morning and evening -CONTINUE Flonase 2 sprays per nare as needed  Tobacco abuse --QUIT smoking  RML stage IA NSCLC -S/p SBRT in 2021 --Scheduled for repeat CT per Rad Onc  Follow-up as needed

## 2021-03-31 ENCOUNTER — Ambulatory Visit (HOSPITAL_COMMUNITY)
Admission: RE | Admit: 2021-03-31 | Discharge: 2021-03-31 | Disposition: A | Discharge status: Home / Self Care | Payer: Medicare HMO | Source: Ambulatory Visit | Attending: Radiation Oncology | Admitting: Radiation Oncology

## 2021-03-31 ENCOUNTER — Encounter: Payer: Self-pay | Admitting: Radiation Oncology

## 2021-03-31 ENCOUNTER — Encounter (HOSPITAL_COMMUNITY): Payer: Self-pay

## 2021-03-31 DIAGNOSIS — C342 Malignant neoplasm of middle lobe, bronchus or lung: Secondary | ICD-10-CM | POA: Insufficient documentation

## 2021-03-31 LAB — POCT I-STAT CREATININE: Creatinine, Ser: 1.4 mg/dL — ABNORMAL HIGH (ref 0.44–1.00)

## 2021-03-31 MED ORDER — IOHEXOL 350 MG/ML SOLN
75.0000 mL | Freq: Once | INTRAVENOUS | Status: AC | PRN
  Administered 2021-03-31: 75 mL via INTRAVENOUS

## 2021-03-31 NOTE — Progress Notes (Signed)
Patient states she is doing well. Experiencing some fatigue, cough, and shortness of breath, with a mildly reduced appetite but no weight loss, and no swallowing issues or choking.  Patient's primary care provider has started her on some oxygen at home, due to reduced O2 readings.  Meaningful use questions complete and patient notified of her 1:30pm appointment on 04/03/21 and expressed an understanding of it.

## 2021-04-01 ENCOUNTER — Other Ambulatory Visit: Payer: Self-pay | Admitting: Pulmonary Disease

## 2021-04-01 ENCOUNTER — Other Ambulatory Visit: Payer: Self-pay | Admitting: Primary Care

## 2021-04-01 DIAGNOSIS — R911 Solitary pulmonary nodule: Secondary | ICD-10-CM

## 2021-04-03 ENCOUNTER — Ambulatory Visit
Admission: RE | Admit: 2021-04-03 | Discharge: 2021-04-03 | Disposition: A | Discharge status: Home / Self Care | Payer: Medicare HMO | Source: Ambulatory Visit | Attending: Radiation Oncology | Admitting: Radiation Oncology

## 2021-04-03 DIAGNOSIS — C342 Malignant neoplasm of middle lobe, bronchus or lung: Secondary | ICD-10-CM

## 2021-04-03 HISTORY — DX: Hypoxemia: R09.02

## 2021-04-03 NOTE — Progress Notes (Signed)
Radiation Oncology         [phone] ________________________________  Outpatient Follow Up - Conducted via telephone due to current COVID-19 concerns for limiting patient exposure  I spoke with the patient to conduct this consult visit via telephone to spare the patient unnecessary potential exposure in the healthcare setting during the current COVID-19 pandemic. The patient was notified in advance and was offered a [place] meeting to allow for face to face communication but unfortunately reported that they did not have the appropriate resources/technology to support such a visit and instead preferred to proceed with a telephone visit.    Name: Katrina Ball        MRN: [id number]  Date of Service: 04/03/2021 DOB: 1945/10/09  BL:TJQZ, Dola Factor, MD  Bartholome Bill, MD     REFERRING PHYSICIAN: Dr. Lamonte Sakai  DIAGNOSIS: The encounter diagnosis was Malignant neoplasm of middle lobe of right lung ([place]).   HISTORY OF PRESENT ILLNESS: Katrina Ball is a 75 y.o. female with a history of Stage IA2, cT1bN0M0, NSCLC of the right middle lobe diagnosed with bronchoscopy with Dr. Lamonte Sakai on 12/29/2019, and final results of the brushing and fine-needle aspirate of the right middle lobe lesion were consistent with malignancy, representing non-small cell lung cancer.  The biopsy of the right middle lobe lung specimen was benign lung parenchyma.  There was no additional tissue to send categorize her pathology or for cell block.  Of note fiducial markers were placed at the time of her procedure. She elected to forgo surgery and rather proceeded with stereotactic body radiotherapy (SBRT) which she completed in June 2021. She's been followed in surveillance since and had a CT chest performed today which showed anticipated post radiation related changes without concerns for active or new disease. She is now established with Dr. Loanne Drilling and has just started supplemental oxygen at 1L nasal cannula.  She had  a CT of the chest on 03/31/2021 that showed posttreatment scarring in the right middle lobe and a stable nodule measuring 4 mm in the left upper lobe.  She did have continued stigmata of emphysematous and atherosclerotic disease as well as an enlarged pulmonary trunk indicative of pulmonary arterial hypertension which is stable.  She is contacted today to review the results of her scan.  PREVIOUS RADIATION THERAPY:  01/26/20-02/02/20 SBRT Treatment: The RML target was treated to 54 Gy in 3 fractions.   PAST MEDICAL HISTORY:  Past Medical History:  Diagnosis Date   Anemia    "used to be" (12/23/2012)   Anxiety    Arrhythmia    Arthritis    Cataract    Chest pain    COPD (chronic obstructive pulmonary disease) (HCC)    Depression    Exertional shortness of breath    Fracture of tibial plateau, closed 12/23/2012   GERD (gastroesophageal reflux disease)    Heart murmur 2016   Dr. Aundra Dubin did not hear a murmer   Hypertension    lung ca dx'd 01/2020   Numbness and tingling in hands    Oxygen decrease    Sleep apnea        PAST SURGICAL HISTORY: Past Surgical History:  Procedure Laterality Date   BRONCHIAL BIOPSY  12/29/2019   Procedure: BRONCHIAL BIOPSIES;  Surgeon: Collene Gobble, MD;  Location: [place];  Service: Pulmonary;;   BRONCHIAL BRUSHINGS  12/29/2019   Procedure: BRONCHIAL BRUSHINGS;  Surgeon: Collene Gobble, MD;  Location: [facility] ENDOSCOPY;  Service: Pulmonary;;   BRONCHIAL NEEDLE  ASPIRATION BIOPSY  12/29/2019   Procedure: BRONCHIAL NEEDLE ASPIRATION BIOPSIES;  Surgeon: Collene Gobble, MD;  Location: [facility] ENDOSCOPY;  Service: Pulmonary;;   CARDIAC CATHETERIZATION N/A 02/17/2015   Procedure: Right Heart Cath;  Surgeon: Larey Dresser, MD;  Location: [place] [facility];  Service: Cardiovascular;  Laterality: N/A;   COLONOSCOPY     FIDUCIAL MARKER PLACEMENT  12/29/2019   Procedure: FIDUCIAL MARKER PLACEMENT;  Surgeon: Collene Gobble, MD;  Location: [facility] ENDOSCOPY;  Service:  Pulmonary;;   INCISION AND DRAINAGE OF WOUND  12/07/2012   "LLE" (12/23/2012)   ORIF PROXIMAL TIBIAL PLATEAU FRACTURE Left 12/23/2012   ORIF TIBIA PLATEAU Left 12/23/2012   Procedure: OPEN REDUCTION INTERNAL FIXATION (ORIF) LEFT TIBIAL PLATEAU;  Surgeon: Johnny Bridge, MD;  Location: [place];  Service: Orthopedics;  Laterality: Left;   TUBAL LIGATION     VIDEO BRONCHOSCOPY WITH ENDOBRONCHIAL NAVIGATION N/A 12/29/2019   Procedure: VIDEO BRONCHOSCOPY WITH ENDOBRONCHIAL NAVIGATION;  Surgeon: Collene Gobble, MD;  Location: [place] ENDOSCOPY;  Service: Pulmonary;  Laterality: N/A;     FAMILY HISTORY:  Family History  Problem Relation Age of Onset   CVA Mother    Heart failure Mother    Heart disease Mother    Asthma Mother      SOCIAL HISTORY:  reports that she has been smoking cigarettes. She has a 11.25 pack-year smoking history. She has never used smokeless tobacco. She reports that she does not currently use alcohol. She reports that she does not use drugs. The patient is widowed and lives in [place], she has adult children in [place] and [place] and other family in [place] and is planning to relocate to the [place] area.   ALLERGIES: Patient has no known allergies.   MEDICATIONS:  Current Outpatient Medications  Medication Sig Dispense Refill   Carboxymethylcellul-Glycerin (LUBRICATING EYE DROPS OP) Place 1 drop into both eyes 2 (two) times daily as needed (dry eyes).     cetirizine (ZYRTEC) 10 MG tablet Take 10 mg by mouth daily.     diazepam (VALIUM) 5 MG tablet Take 1 tablet (5 mg total) by mouth 2 (two) times daily. 10 tablet 0   FLUoxetine (PROZAC) 20 MG capsule Take 60 mg by mouth daily.     fluticasone (FLONASE) 50 MCG/ACT nasal spray Place 2 sprays into both nostrils daily. 16 g 11   Fluticasone-Umeclidin-Vilant (TRELEGY ELLIPTA) 100-62.5-25 MCG/INH AEPB Inhale 1 puff into the lungs daily. 28 each 3   HYDROcodone-acetaminophen (NORCO/VICODIN) 5-325 MG tablet Take 1  tablet by mouth every 4 (four) hours as needed. 10 tablet 0   Magnesium 400 MG TABS Take 400 mg by mouth daily.     naproxen sodium (ALEVE) 220 MG tablet Take 220 mg by mouth daily as needed (pain).     nicotine polacrilex (NICORETTE) 4 MG gum Take 4 mg by mouth as needed for smoking cessation.     olmesartan-hydrochlorothiazide (BENICAR HCT) 40-25 MG tablet Take 1 tablet by mouth daily.     predniSONE (DELTASONE) 10 MG tablet Take 4 tabs po daily x 2 days; then 3 tabs for 2 days; then 2 tabs for 2 days; then 1 tab for 2 days 20 tablet 0   promethazine (PHENERGAN) 6.25 MG/5ML syrup Take 10 mLs (12.5 mg total) by mouth every 8 (eight) hours as needed (cough). No driving when taking 962 mL 0   Psyllium (METAMUCIL PO) Take 1 Dose by mouth daily as needed (constipation).     Azelastine HCl  137 MCG/SPRAY SOLN PLACE 1 SPRAY INTO BOTH NOSTRILS 2 (TWO) TIMES DAILY. USE IN EACH NOSTRIL AS DIRECTED 30 mL 1   VENTOLIN HFA 108 (90 Base) MCG/ACT inhaler INHALE 2 PUFFS BY MOUTH EVERY 4 HOURS AS NEEDED FOR WHEEZE OR FOR SHORTNESS OF BREATH 18 each 3   No current facility-administered medications for this encounter.     REVIEW OF SYSTEMS: On review of systems the patient reports she is doing quite well, she states that she is feeling better since using the oxygen and reports that she is going to try just using it at night.  She denies any new symptoms since our last conversation and specifically denies any chest pain productive cough or hemoptysis.  She would like to be referred to a radiation clinic closer to where she will be moving to for convenience of her long-term follow-up.  PHYSICAL EXAM:  Unable to assess due to encounter type.    ECOG = 1  0 - Asymptomatic (Fully active, able to carry on all predisease activities without restriction)  1 - Symptomatic but completely ambulatory (Restricted in physically strenuous activity but ambulatory and able to carry out work of a light or sedentary nature. For  example, light housework, office work)  2 - Symptomatic, <50% in bed during the day (Ambulatory and capable of all self care but unable to carry out any work activities. Up and about more than 50% of waking hours)  3 - Symptomatic, >50% in bed, but not bedbound (Capable of only limited self-care, confined to bed or chair 50% or more of waking hours)  4 - Bedbound (Completely disabled. Cannot carry on any self-care. Totally confined to bed or chair)  5 - Death   Eustace Pen MM, Creech RH, Tormey DC, et al. [phone]). "Toxicity and response criteria of the [facility]". [place] Oncol. 5 (6): 649-55    LABORATORY DATA:  Lab Results  Component Value Date   WBC 9.2 01/18/2021   HGB 12.4 01/18/2021   HCT 38.3 01/18/2021   MCV 95.8 01/18/2021   PLT 293 01/18/2021   Lab Results  Component Value Date   NA 133 (L) 01/18/2021   K 4.9 01/18/2021   CL 98 01/18/2021   CO2 25 01/18/2021   Lab Results  Component Value Date   ALT 11 01/18/2021   AST 19 01/18/2021   ALKPHOS 80 01/18/2021   BILITOT 0.2 (L) 01/18/2021      RADIOGRAPHY: CT CHEST W CONTRAST  Result Date: 04/03/2021 CLINICAL DATA:  Lung cancer. EXAM: CT CHEST WITH CONTRAST TECHNIQUE: Multidetector CT imaging of the chest was performed during intravenous contrast administration. CONTRAST:  47mL OMNIPAQUE IOHEXOL 350 MG/ML SOLN COMPARISON:  09/28/2020. FINDINGS: Cardiovascular: Atherosclerotic calcification of the aorta, aortic valve and coronary arteries. Pulmonic trunk is enlarged. Heart is at the upper limits of normal in size. Small amount of pericardial fluid is similar and likely physiologic. Mediastinum/Nodes: Subcentimeter low-attenuation lesions in the thyroid. No follow-up recommended. (Ref: J Am Coll Radiol. 2015 Feb;12(2): 143-50).No pathologically enlarged mediastinal, hilar or axillary lymph nodes. Esophagus is grossly unremarkable. Lungs/Pleura: Centrilobular emphysema. Chronic smooth septal  thickening in the upper lobes. Scattered fiducial markers in the right middle lobe. Slightly nodular appearing scarring in the lateral segment right middle lobe (7/95), unchanged. Additional scattered pleuroparenchymal scarring bilaterally. 4 mm medial left upper lobe nodule (7/76), unchanged. No pleural fluid. Airway is unremarkable. Upper Abdomen: Visualized portions of the liver, adrenal glands, kidneys, spleen, pancreas, stomach and bowel are  grossly unremarkable. Musculoskeletal: Degenerative changes in the spine. No worrisome lytic or sclerotic lesions. IMPRESSION: 1. Post treatment scarring in the right middle lobe. No evidence of recurrent or metastatic disease. 2. Aortic atherosclerosis (ICD10-I70.0). Coronary artery calcification. 3. Enlarged pulmonic trunk, indicative of pulmonary arterial hypertension. 4.  Emphysema (ICD10-J43.9). Electronically Signed   By: Lorin Picket M.D.   On: 04/03/2021 08:48        IMPRESSION/PLAN: 1. Stage IA2, cT1bN0M0, NSCLC of the right middle lobe.  The patient is done very well since her radiotherapy last year.  She remains without evidence of disease radiographically and clinically verbalizes that she has been doing well since starting oxygen for her COPD.  We reviewed the rationale for routine CT surveillance at 4-month intervalsper NCCN guidelines.  She will be due for her next CT scan in February 2023 and we will make a referral to Dr. Tomma Lightning at [facility] in [place] for follow-up.  We will request her dosimetry and treatment related records to be sent as well as power share her CT images for comparison.  She is encouraged to keep [place] informed if she has any changes to her plan of relocating as we would be happy to follow her in the long-term. 2. History of COPD and pulmonary hypertension.  She will continue with Dr. Loanne Drilling until she decides to relocate to [facility] but plans to do so in the next couple of months.      Given current concerns  for patient exposure during the COVID-19 pandemic, this encounter was conducted via telephone.  The patient has provided two factor identification and has given verbal consent for this type of encounter and has been advised to only accept a meeting of this type in a secure network environment. The time spent during this encounter was 30 minutes including preparation, discussion, and coordination of the patient's care. The attendants for this meeting include  Hayden Pedro  and [place].  During the encounter, Hayden Pedro were located at [facility] Radiation Oncology Department.  Nykerria Macconnell Prater was located at home.       Carola Rhine, PAC

## 2021-04-07 ENCOUNTER — Other Ambulatory Visit: Payer: Medicare HMO

## 2021-04-26 ENCOUNTER — Other Ambulatory Visit: Payer: Self-pay | Admitting: Pulmonary Disease

## 2021-04-29 ENCOUNTER — Encounter: Payer: Self-pay | Admitting: Pulmonary Disease

## 2021-12-22 ENCOUNTER — Other Ambulatory Visit: Payer: Self-pay | Admitting: Pulmonary Disease

## 2022-06-22 ENCOUNTER — Other Ambulatory Visit: Payer: Self-pay | Admitting: Pulmonary Disease
